# Patient Record
Sex: Female | Born: 1945 | Hispanic: No | Marital: Single | Smoking: Former smoker
Health system: Southern US, Community
[De-identification: ages and names within clinical notes are randomized; demographics above are authoritative.]

## PROBLEM LIST (undated history)

## (undated) DIAGNOSIS — J449 Chronic obstructive pulmonary disease, unspecified: Secondary | ICD-10-CM

## (undated) DIAGNOSIS — I4891 Unspecified atrial fibrillation: Secondary | ICD-10-CM

## (undated) DIAGNOSIS — E119 Type 2 diabetes mellitus without complications: Secondary | ICD-10-CM

---

## 2015-12-05 DIAGNOSIS — R05 Cough: Secondary | ICD-10-CM | POA: Diagnosis not present

## 2015-12-05 DIAGNOSIS — J069 Acute upper respiratory infection, unspecified: Secondary | ICD-10-CM | POA: Diagnosis not present

## 2016-01-04 DIAGNOSIS — J449 Chronic obstructive pulmonary disease, unspecified: Secondary | ICD-10-CM | POA: Diagnosis not present

## 2016-01-18 DIAGNOSIS — Z720 Tobacco use: Secondary | ICD-10-CM | POA: Diagnosis not present

## 2016-01-18 DIAGNOSIS — J441 Chronic obstructive pulmonary disease with (acute) exacerbation: Secondary | ICD-10-CM | POA: Diagnosis not present

## 2016-01-18 DIAGNOSIS — R0602 Shortness of breath: Secondary | ICD-10-CM | POA: Diagnosis not present

## 2016-01-18 DIAGNOSIS — R69 Illness, unspecified: Secondary | ICD-10-CM | POA: Diagnosis not present

## 2016-01-30 DIAGNOSIS — J441 Chronic obstructive pulmonary disease with (acute) exacerbation: Secondary | ICD-10-CM | POA: Diagnosis not present

## 2016-01-30 DIAGNOSIS — Z6828 Body mass index (BMI) 28.0-28.9, adult: Secondary | ICD-10-CM | POA: Diagnosis not present

## 2016-01-30 DIAGNOSIS — E663 Overweight: Secondary | ICD-10-CM | POA: Diagnosis not present

## 2016-01-30 DIAGNOSIS — B370 Candidal stomatitis: Secondary | ICD-10-CM | POA: Diagnosis not present

## 2016-01-31 DIAGNOSIS — R5383 Other fatigue: Secondary | ICD-10-CM | POA: Diagnosis not present

## 2016-01-31 DIAGNOSIS — E559 Vitamin D deficiency, unspecified: Secondary | ICD-10-CM | POA: Diagnosis not present

## 2016-01-31 DIAGNOSIS — R918 Other nonspecific abnormal finding of lung field: Secondary | ICD-10-CM | POA: Diagnosis not present

## 2016-01-31 DIAGNOSIS — J449 Chronic obstructive pulmonary disease, unspecified: Secondary | ICD-10-CM | POA: Diagnosis not present

## 2016-01-31 DIAGNOSIS — G4733 Obstructive sleep apnea (adult) (pediatric): Secondary | ICD-10-CM | POA: Diagnosis not present

## 2016-01-31 DIAGNOSIS — J301 Allergic rhinitis due to pollen: Secondary | ICD-10-CM | POA: Diagnosis not present

## 2016-01-31 DIAGNOSIS — R69 Illness, unspecified: Secondary | ICD-10-CM | POA: Diagnosis not present

## 2016-02-27 DIAGNOSIS — Z1389 Encounter for screening for other disorder: Secondary | ICD-10-CM | POA: Diagnosis not present

## 2016-02-27 DIAGNOSIS — E559 Vitamin D deficiency, unspecified: Secondary | ICD-10-CM | POA: Diagnosis not present

## 2016-02-27 DIAGNOSIS — R69 Illness, unspecified: Secondary | ICD-10-CM | POA: Diagnosis not present

## 2016-02-27 DIAGNOSIS — R252 Cramp and spasm: Secondary | ICD-10-CM | POA: Diagnosis not present

## 2016-02-27 DIAGNOSIS — M810 Age-related osteoporosis without current pathological fracture: Secondary | ICD-10-CM | POA: Diagnosis not present

## 2016-02-27 DIAGNOSIS — Z79899 Other long term (current) drug therapy: Secondary | ICD-10-CM | POA: Diagnosis not present

## 2016-02-27 DIAGNOSIS — R635 Abnormal weight gain: Secondary | ICD-10-CM | POA: Diagnosis not present

## 2016-02-27 DIAGNOSIS — R7303 Prediabetes: Secondary | ICD-10-CM | POA: Diagnosis not present

## 2016-02-27 DIAGNOSIS — E785 Hyperlipidemia, unspecified: Secondary | ICD-10-CM | POA: Diagnosis not present

## 2016-02-27 DIAGNOSIS — J449 Chronic obstructive pulmonary disease, unspecified: Secondary | ICD-10-CM | POA: Diagnosis not present

## 2016-02-27 DIAGNOSIS — K210 Gastro-esophageal reflux disease with esophagitis: Secondary | ICD-10-CM | POA: Diagnosis not present

## 2016-03-13 DIAGNOSIS — M7700 Medial epicondylitis, unspecified elbow: Secondary | ICD-10-CM | POA: Diagnosis not present

## 2016-03-17 DIAGNOSIS — K529 Noninfective gastroenteritis and colitis, unspecified: Secondary | ICD-10-CM | POA: Diagnosis not present

## 2016-04-02 DIAGNOSIS — G4733 Obstructive sleep apnea (adult) (pediatric): Secondary | ICD-10-CM | POA: Diagnosis not present

## 2016-04-08 DIAGNOSIS — R220 Localized swelling, mass and lump, head: Secondary | ICD-10-CM | POA: Diagnosis not present

## 2016-04-16 DIAGNOSIS — R6884 Jaw pain: Secondary | ICD-10-CM | POA: Diagnosis not present

## 2016-04-16 DIAGNOSIS — M542 Cervicalgia: Secondary | ICD-10-CM | POA: Diagnosis not present

## 2016-04-16 DIAGNOSIS — Z720 Tobacco use: Secondary | ICD-10-CM | POA: Diagnosis not present

## 2016-04-16 DIAGNOSIS — M50323 Other cervical disc degeneration at C6-C7 level: Secondary | ICD-10-CM | POA: Diagnosis not present

## 2016-04-16 DIAGNOSIS — M50322 Other cervical disc degeneration at C5-C6 level: Secondary | ICD-10-CM | POA: Diagnosis not present

## 2016-04-28 DIAGNOSIS — R6884 Jaw pain: Secondary | ICD-10-CM | POA: Diagnosis not present

## 2016-04-30 DIAGNOSIS — R030 Elevated blood-pressure reading, without diagnosis of hypertension: Secondary | ICD-10-CM | POA: Diagnosis not present

## 2016-04-30 DIAGNOSIS — H9201 Otalgia, right ear: Secondary | ICD-10-CM | POA: Diagnosis not present

## 2016-04-30 DIAGNOSIS — R220 Localized swelling, mass and lump, head: Secondary | ICD-10-CM | POA: Diagnosis not present

## 2016-04-30 DIAGNOSIS — K1120 Sialoadenitis, unspecified: Secondary | ICD-10-CM | POA: Diagnosis not present

## 2016-04-30 DIAGNOSIS — R591 Generalized enlarged lymph nodes: Secondary | ICD-10-CM | POA: Diagnosis not present

## 2016-05-09 DIAGNOSIS — N281 Cyst of kidney, acquired: Secondary | ICD-10-CM | POA: Diagnosis not present

## 2016-05-09 DIAGNOSIS — N200 Calculus of kidney: Secondary | ICD-10-CM | POA: Diagnosis not present

## 2016-05-09 DIAGNOSIS — J189 Pneumonia, unspecified organism: Secondary | ICD-10-CM | POA: Diagnosis not present

## 2016-05-09 DIAGNOSIS — K5100 Ulcerative (chronic) pancolitis without complications: Secondary | ICD-10-CM | POA: Diagnosis not present

## 2016-05-20 DIAGNOSIS — J449 Chronic obstructive pulmonary disease, unspecified: Secondary | ICD-10-CM | POA: Diagnosis not present

## 2016-05-20 DIAGNOSIS — I340 Nonrheumatic mitral (valve) insufficiency: Secondary | ICD-10-CM | POA: Diagnosis not present

## 2016-05-20 DIAGNOSIS — F17210 Nicotine dependence, cigarettes, uncomplicated: Secondary | ICD-10-CM | POA: Diagnosis not present

## 2016-05-20 DIAGNOSIS — I361 Nonrheumatic tricuspid (valve) insufficiency: Secondary | ICD-10-CM | POA: Diagnosis not present

## 2016-05-20 DIAGNOSIS — Z888 Allergy status to other drugs, medicaments and biological substances status: Secondary | ICD-10-CM | POA: Diagnosis not present

## 2016-05-20 DIAGNOSIS — R69 Illness, unspecified: Secondary | ICD-10-CM | POA: Diagnosis not present

## 2016-05-20 DIAGNOSIS — R609 Edema, unspecified: Secondary | ICD-10-CM | POA: Diagnosis not present

## 2016-05-20 DIAGNOSIS — R0789 Other chest pain: Secondary | ICD-10-CM | POA: Diagnosis not present

## 2016-05-20 DIAGNOSIS — K219 Gastro-esophageal reflux disease without esophagitis: Secondary | ICD-10-CM | POA: Diagnosis not present

## 2016-05-20 DIAGNOSIS — R05 Cough: Secondary | ICD-10-CM | POA: Diagnosis not present

## 2016-05-20 DIAGNOSIS — M25511 Pain in right shoulder: Secondary | ICD-10-CM | POA: Diagnosis not present

## 2016-05-20 DIAGNOSIS — J45909 Unspecified asthma, uncomplicated: Secondary | ICD-10-CM | POA: Diagnosis not present

## 2016-05-20 DIAGNOSIS — I4891 Unspecified atrial fibrillation: Secondary | ICD-10-CM | POA: Diagnosis not present

## 2016-05-20 DIAGNOSIS — R0602 Shortness of breath: Secondary | ICD-10-CM | POA: Diagnosis not present

## 2016-05-20 DIAGNOSIS — E041 Nontoxic single thyroid nodule: Secondary | ICD-10-CM | POA: Diagnosis not present

## 2016-05-20 DIAGNOSIS — I480 Paroxysmal atrial fibrillation: Secondary | ICD-10-CM | POA: Diagnosis not present

## 2016-05-20 DIAGNOSIS — F418 Other specified anxiety disorders: Secondary | ICD-10-CM

## 2016-05-20 DIAGNOSIS — R079 Chest pain, unspecified: Secondary | ICD-10-CM | POA: Diagnosis not present

## 2016-05-21 DIAGNOSIS — J449 Chronic obstructive pulmonary disease, unspecified: Secondary | ICD-10-CM | POA: Diagnosis not present

## 2016-05-21 DIAGNOSIS — I361 Nonrheumatic tricuspid (valve) insufficiency: Secondary | ICD-10-CM | POA: Diagnosis not present

## 2016-05-21 DIAGNOSIS — R079 Chest pain, unspecified: Secondary | ICD-10-CM | POA: Diagnosis not present

## 2016-05-21 DIAGNOSIS — M25511 Pain in right shoulder: Secondary | ICD-10-CM | POA: Diagnosis not present

## 2016-05-21 DIAGNOSIS — R0789 Other chest pain: Secondary | ICD-10-CM | POA: Diagnosis not present

## 2016-05-21 DIAGNOSIS — I340 Nonrheumatic mitral (valve) insufficiency: Secondary | ICD-10-CM | POA: Diagnosis not present

## 2016-05-21 DIAGNOSIS — I4891 Unspecified atrial fibrillation: Secondary | ICD-10-CM | POA: Diagnosis not present

## 2016-05-21 DIAGNOSIS — R69 Illness, unspecified: Secondary | ICD-10-CM | POA: Diagnosis not present

## 2016-05-22 DIAGNOSIS — J449 Chronic obstructive pulmonary disease, unspecified: Secondary | ICD-10-CM | POA: Diagnosis not present

## 2016-05-22 DIAGNOSIS — R69 Illness, unspecified: Secondary | ICD-10-CM | POA: Diagnosis not present

## 2016-05-22 DIAGNOSIS — I4891 Unspecified atrial fibrillation: Secondary | ICD-10-CM | POA: Diagnosis not present

## 2016-05-22 DIAGNOSIS — M25511 Pain in right shoulder: Secondary | ICD-10-CM | POA: Diagnosis not present

## 2016-05-22 DIAGNOSIS — R0789 Other chest pain: Secondary | ICD-10-CM | POA: Diagnosis not present

## 2016-05-23 DIAGNOSIS — R69 Illness, unspecified: Secondary | ICD-10-CM | POA: Diagnosis not present

## 2016-05-23 DIAGNOSIS — I4891 Unspecified atrial fibrillation: Secondary | ICD-10-CM | POA: Diagnosis not present

## 2016-05-23 DIAGNOSIS — J449 Chronic obstructive pulmonary disease, unspecified: Secondary | ICD-10-CM | POA: Diagnosis not present

## 2016-05-23 DIAGNOSIS — R0789 Other chest pain: Secondary | ICD-10-CM | POA: Diagnosis not present

## 2016-05-23 DIAGNOSIS — M25511 Pain in right shoulder: Secondary | ICD-10-CM | POA: Diagnosis not present

## 2016-05-30 DIAGNOSIS — D649 Anemia, unspecified: Secondary | ICD-10-CM | POA: Diagnosis not present

## 2016-05-30 DIAGNOSIS — J449 Chronic obstructive pulmonary disease, unspecified: Secondary | ICD-10-CM | POA: Diagnosis not present

## 2016-05-30 DIAGNOSIS — M25532 Pain in left wrist: Secondary | ICD-10-CM | POA: Diagnosis not present

## 2016-05-30 DIAGNOSIS — E876 Hypokalemia: Secondary | ICD-10-CM | POA: Diagnosis not present

## 2016-05-30 DIAGNOSIS — R072 Precordial pain: Secondary | ICD-10-CM | POA: Diagnosis not present

## 2016-05-30 DIAGNOSIS — E041 Nontoxic single thyroid nodule: Secondary | ICD-10-CM

## 2016-05-30 DIAGNOSIS — J45909 Unspecified asthma, uncomplicated: Secondary | ICD-10-CM | POA: Diagnosis not present

## 2016-05-30 DIAGNOSIS — R0600 Dyspnea, unspecified: Secondary | ICD-10-CM | POA: Diagnosis not present

## 2016-05-30 DIAGNOSIS — A047 Enterocolitis due to Clostridium difficile: Secondary | ICD-10-CM | POA: Diagnosis not present

## 2016-05-30 DIAGNOSIS — I4891 Unspecified atrial fibrillation: Secondary | ICD-10-CM | POA: Diagnosis not present

## 2016-05-30 DIAGNOSIS — E871 Hypo-osmolality and hyponatremia: Secondary | ICD-10-CM | POA: Diagnosis not present

## 2016-05-30 DIAGNOSIS — I480 Paroxysmal atrial fibrillation: Secondary | ICD-10-CM | POA: Diagnosis not present

## 2016-05-30 DIAGNOSIS — S6992XA Unspecified injury of left wrist, hand and finger(s), initial encounter: Secondary | ICD-10-CM | POA: Diagnosis not present

## 2016-05-30 DIAGNOSIS — M11232 Other chondrocalcinosis, left wrist: Secondary | ICD-10-CM | POA: Diagnosis not present

## 2016-05-30 DIAGNOSIS — I482 Chronic atrial fibrillation: Secondary | ICD-10-CM | POA: Diagnosis not present

## 2016-05-30 DIAGNOSIS — R079 Chest pain, unspecified: Secondary | ICD-10-CM | POA: Diagnosis not present

## 2016-05-30 DIAGNOSIS — M10032 Idiopathic gout, left wrist: Secondary | ICD-10-CM | POA: Diagnosis not present

## 2016-05-30 DIAGNOSIS — M79602 Pain in left arm: Secondary | ICD-10-CM | POA: Diagnosis not present

## 2016-05-30 DIAGNOSIS — M7989 Other specified soft tissue disorders: Secondary | ICD-10-CM | POA: Diagnosis not present

## 2016-05-31 DIAGNOSIS — M25532 Pain in left wrist: Secondary | ICD-10-CM | POA: Diagnosis not present

## 2016-05-31 DIAGNOSIS — J449 Chronic obstructive pulmonary disease, unspecified: Secondary | ICD-10-CM | POA: Diagnosis not present

## 2016-05-31 DIAGNOSIS — I4891 Unspecified atrial fibrillation: Secondary | ICD-10-CM | POA: Diagnosis not present

## 2016-05-31 DIAGNOSIS — A047 Enterocolitis due to Clostridium difficile: Secondary | ICD-10-CM | POA: Diagnosis not present

## 2016-05-31 DIAGNOSIS — M10032 Idiopathic gout, left wrist: Secondary | ICD-10-CM | POA: Diagnosis not present

## 2016-06-01 DIAGNOSIS — J449 Chronic obstructive pulmonary disease, unspecified: Secondary | ICD-10-CM | POA: Diagnosis not present

## 2016-06-01 DIAGNOSIS — I4891 Unspecified atrial fibrillation: Secondary | ICD-10-CM | POA: Diagnosis not present

## 2016-06-01 DIAGNOSIS — R079 Chest pain, unspecified: Secondary | ICD-10-CM | POA: Diagnosis not present

## 2016-06-01 DIAGNOSIS — A047 Enterocolitis due to Clostridium difficile: Secondary | ICD-10-CM | POA: Diagnosis not present

## 2016-06-01 DIAGNOSIS — M25532 Pain in left wrist: Secondary | ICD-10-CM | POA: Diagnosis not present

## 2016-06-01 DIAGNOSIS — M10032 Idiopathic gout, left wrist: Secondary | ICD-10-CM | POA: Diagnosis not present

## 2016-06-02 DIAGNOSIS — I4891 Unspecified atrial fibrillation: Secondary | ICD-10-CM | POA: Diagnosis not present

## 2016-06-02 DIAGNOSIS — A047 Enterocolitis due to Clostridium difficile: Secondary | ICD-10-CM | POA: Diagnosis not present

## 2016-06-02 DIAGNOSIS — J449 Chronic obstructive pulmonary disease, unspecified: Secondary | ICD-10-CM | POA: Diagnosis not present

## 2016-06-02 DIAGNOSIS — M25532 Pain in left wrist: Secondary | ICD-10-CM | POA: Diagnosis not present

## 2016-06-03 DIAGNOSIS — Z9189 Other specified personal risk factors, not elsewhere classified: Secondary | ICD-10-CM | POA: Diagnosis not present

## 2016-06-03 DIAGNOSIS — M11232 Other chondrocalcinosis, left wrist: Secondary | ICD-10-CM | POA: Diagnosis not present

## 2016-06-03 DIAGNOSIS — I4891 Unspecified atrial fibrillation: Secondary | ICD-10-CM | POA: Diagnosis not present

## 2016-06-03 DIAGNOSIS — R319 Hematuria, unspecified: Secondary | ICD-10-CM | POA: Diagnosis not present

## 2016-06-03 DIAGNOSIS — D72829 Elevated white blood cell count, unspecified: Secondary | ICD-10-CM | POA: Diagnosis not present

## 2016-06-03 DIAGNOSIS — A047 Enterocolitis due to Clostridium difficile: Secondary | ICD-10-CM | POA: Diagnosis not present

## 2016-06-03 DIAGNOSIS — E871 Hypo-osmolality and hyponatremia: Secondary | ICD-10-CM | POA: Diagnosis not present

## 2016-06-03 DIAGNOSIS — J441 Chronic obstructive pulmonary disease with (acute) exacerbation: Secondary | ICD-10-CM | POA: Diagnosis not present

## 2016-06-09 DIAGNOSIS — J449 Chronic obstructive pulmonary disease, unspecified: Secondary | ICD-10-CM | POA: Diagnosis not present

## 2016-06-09 DIAGNOSIS — R918 Other nonspecific abnormal finding of lung field: Secondary | ICD-10-CM | POA: Diagnosis not present

## 2016-06-09 DIAGNOSIS — G4733 Obstructive sleep apnea (adult) (pediatric): Secondary | ICD-10-CM | POA: Diagnosis not present

## 2016-06-09 DIAGNOSIS — J301 Allergic rhinitis due to pollen: Secondary | ICD-10-CM | POA: Diagnosis not present

## 2016-06-10 DIAGNOSIS — E785 Hyperlipidemia, unspecified: Secondary | ICD-10-CM | POA: Diagnosis not present

## 2016-06-10 DIAGNOSIS — I10 Essential (primary) hypertension: Secondary | ICD-10-CM | POA: Diagnosis not present

## 2016-06-10 DIAGNOSIS — I480 Paroxysmal atrial fibrillation: Secondary | ICD-10-CM | POA: Diagnosis not present

## 2016-06-10 DIAGNOSIS — R69 Illness, unspecified: Secondary | ICD-10-CM | POA: Diagnosis not present

## 2016-06-19 DIAGNOSIS — D72829 Elevated white blood cell count, unspecified: Secondary | ICD-10-CM | POA: Diagnosis not present

## 2016-06-24 DIAGNOSIS — R918 Other nonspecific abnormal finding of lung field: Secondary | ICD-10-CM | POA: Diagnosis not present

## 2016-06-24 DIAGNOSIS — N281 Cyst of kidney, acquired: Secondary | ICD-10-CM | POA: Diagnosis not present

## 2016-06-24 DIAGNOSIS — D649 Anemia, unspecified: Secondary | ICD-10-CM | POA: Diagnosis not present

## 2016-06-25 DIAGNOSIS — R69 Illness, unspecified: Secondary | ICD-10-CM | POA: Diagnosis not present

## 2016-06-25 DIAGNOSIS — E785 Hyperlipidemia, unspecified: Secondary | ICD-10-CM | POA: Diagnosis not present

## 2016-06-25 DIAGNOSIS — I481 Persistent atrial fibrillation: Secondary | ICD-10-CM | POA: Diagnosis not present

## 2016-06-25 DIAGNOSIS — Z789 Other specified health status: Secondary | ICD-10-CM | POA: Diagnosis not present

## 2016-07-03 DIAGNOSIS — R220 Localized swelling, mass and lump, head: Secondary | ICD-10-CM | POA: Diagnosis not present

## 2016-07-03 DIAGNOSIS — J439 Emphysema, unspecified: Secondary | ICD-10-CM | POA: Diagnosis not present

## 2016-07-03 DIAGNOSIS — R591 Generalized enlarged lymph nodes: Secondary | ICD-10-CM | POA: Diagnosis not present

## 2016-07-03 DIAGNOSIS — H9201 Otalgia, right ear: Secondary | ICD-10-CM | POA: Diagnosis not present

## 2016-07-03 DIAGNOSIS — R221 Localized swelling, mass and lump, neck: Secondary | ICD-10-CM | POA: Diagnosis not present

## 2016-07-08 DIAGNOSIS — I4891 Unspecified atrial fibrillation: Secondary | ICD-10-CM | POA: Diagnosis not present

## 2016-07-08 DIAGNOSIS — R0602 Shortness of breath: Secondary | ICD-10-CM | POA: Diagnosis not present

## 2016-07-08 DIAGNOSIS — D72829 Elevated white blood cell count, unspecified: Secondary | ICD-10-CM | POA: Diagnosis not present

## 2016-07-08 DIAGNOSIS — R609 Edema, unspecified: Secondary | ICD-10-CM | POA: Diagnosis not present

## 2016-07-08 DIAGNOSIS — D649 Anemia, unspecified: Secondary | ICD-10-CM | POA: Diagnosis not present

## 2016-07-08 DIAGNOSIS — E871 Hypo-osmolality and hyponatremia: Secondary | ICD-10-CM | POA: Diagnosis not present

## 2016-07-08 DIAGNOSIS — A047 Enterocolitis due to Clostridium difficile: Secondary | ICD-10-CM | POA: Diagnosis not present

## 2016-07-08 DIAGNOSIS — J441 Chronic obstructive pulmonary disease with (acute) exacerbation: Secondary | ICD-10-CM | POA: Diagnosis not present

## 2016-07-08 DIAGNOSIS — R319 Hematuria, unspecified: Secondary | ICD-10-CM | POA: Diagnosis not present

## 2016-07-08 DIAGNOSIS — M11232 Other chondrocalcinosis, left wrist: Secondary | ICD-10-CM | POA: Diagnosis not present

## 2016-07-14 DIAGNOSIS — I7090 Unspecified atherosclerosis: Secondary | ICD-10-CM | POA: Diagnosis not present

## 2016-07-14 DIAGNOSIS — M479 Spondylosis, unspecified: Secondary | ICD-10-CM | POA: Diagnosis not present

## 2016-07-14 DIAGNOSIS — G4733 Obstructive sleep apnea (adult) (pediatric): Secondary | ICD-10-CM | POA: Diagnosis not present

## 2016-07-14 DIAGNOSIS — H9201 Otalgia, right ear: Secondary | ICD-10-CM | POA: Diagnosis not present

## 2016-07-14 DIAGNOSIS — I899 Noninfective disorder of lymphatic vessels and lymph nodes, unspecified: Secondary | ICD-10-CM | POA: Diagnosis not present

## 2016-07-14 DIAGNOSIS — J439 Emphysema, unspecified: Secondary | ICD-10-CM | POA: Diagnosis not present

## 2016-07-14 DIAGNOSIS — R220 Localized swelling, mass and lump, head: Secondary | ICD-10-CM | POA: Diagnosis not present

## 2016-07-22 DIAGNOSIS — R0602 Shortness of breath: Secondary | ICD-10-CM | POA: Diagnosis not present

## 2016-07-22 DIAGNOSIS — G4730 Sleep apnea, unspecified: Secondary | ICD-10-CM | POA: Diagnosis not present

## 2016-07-22 DIAGNOSIS — I10 Essential (primary) hypertension: Secondary | ICD-10-CM | POA: Diagnosis not present

## 2016-07-22 DIAGNOSIS — I4891 Unspecified atrial fibrillation: Secondary | ICD-10-CM | POA: Diagnosis not present

## 2016-07-22 DIAGNOSIS — K219 Gastro-esophageal reflux disease without esophagitis: Secondary | ICD-10-CM | POA: Diagnosis not present

## 2016-07-22 DIAGNOSIS — M810 Age-related osteoporosis without current pathological fracture: Secondary | ICD-10-CM | POA: Diagnosis not present

## 2016-07-22 DIAGNOSIS — H9312 Tinnitus, left ear: Secondary | ICD-10-CM | POA: Diagnosis not present

## 2016-07-22 DIAGNOSIS — E785 Hyperlipidemia, unspecified: Secondary | ICD-10-CM | POA: Diagnosis not present

## 2016-07-22 DIAGNOSIS — I481 Persistent atrial fibrillation: Secondary | ICD-10-CM | POA: Diagnosis not present

## 2016-07-22 DIAGNOSIS — J45909 Unspecified asthma, uncomplicated: Secondary | ICD-10-CM | POA: Diagnosis not present

## 2016-07-22 DIAGNOSIS — Z01810 Encounter for preprocedural cardiovascular examination: Secondary | ICD-10-CM | POA: Diagnosis not present

## 2016-07-22 DIAGNOSIS — J449 Chronic obstructive pulmonary disease, unspecified: Secondary | ICD-10-CM | POA: Diagnosis not present

## 2016-08-05 DIAGNOSIS — I4891 Unspecified atrial fibrillation: Secondary | ICD-10-CM | POA: Diagnosis not present

## 2016-08-05 DIAGNOSIS — R0602 Shortness of breath: Secondary | ICD-10-CM | POA: Diagnosis not present

## 2016-08-05 DIAGNOSIS — M11232 Other chondrocalcinosis, left wrist: Secondary | ICD-10-CM | POA: Diagnosis not present

## 2016-08-05 DIAGNOSIS — E663 Overweight: Secondary | ICD-10-CM | POA: Diagnosis not present

## 2016-08-05 DIAGNOSIS — R195 Other fecal abnormalities: Secondary | ICD-10-CM | POA: Diagnosis not present

## 2016-08-05 DIAGNOSIS — Z79899 Other long term (current) drug therapy: Secondary | ICD-10-CM | POA: Diagnosis not present

## 2016-08-05 DIAGNOSIS — Z23 Encounter for immunization: Secondary | ICD-10-CM | POA: Diagnosis not present

## 2016-08-05 DIAGNOSIS — Z1212 Encounter for screening for malignant neoplasm of rectum: Secondary | ICD-10-CM | POA: Diagnosis not present

## 2016-08-13 DIAGNOSIS — E785 Hyperlipidemia, unspecified: Secondary | ICD-10-CM | POA: Diagnosis not present

## 2016-08-13 DIAGNOSIS — I10 Essential (primary) hypertension: Secondary | ICD-10-CM | POA: Diagnosis not present

## 2016-08-13 DIAGNOSIS — Z789 Other specified health status: Secondary | ICD-10-CM | POA: Diagnosis not present

## 2016-08-13 DIAGNOSIS — I481 Persistent atrial fibrillation: Secondary | ICD-10-CM | POA: Diagnosis not present

## 2016-08-13 DIAGNOSIS — J449 Chronic obstructive pulmonary disease, unspecified: Secondary | ICD-10-CM | POA: Diagnosis not present

## 2016-08-25 DIAGNOSIS — J301 Allergic rhinitis due to pollen: Secondary | ICD-10-CM | POA: Diagnosis not present

## 2016-08-25 DIAGNOSIS — R918 Other nonspecific abnormal finding of lung field: Secondary | ICD-10-CM | POA: Diagnosis not present

## 2016-08-25 DIAGNOSIS — J449 Chronic obstructive pulmonary disease, unspecified: Secondary | ICD-10-CM | POA: Diagnosis not present

## 2016-08-25 DIAGNOSIS — G4733 Obstructive sleep apnea (adult) (pediatric): Secondary | ICD-10-CM | POA: Diagnosis not present

## 2016-09-04 DIAGNOSIS — G4733 Obstructive sleep apnea (adult) (pediatric): Secondary | ICD-10-CM | POA: Diagnosis not present

## 2016-09-11 DIAGNOSIS — I081 Rheumatic disorders of both mitral and tricuspid valves: Secondary | ICD-10-CM | POA: Diagnosis not present

## 2016-09-11 DIAGNOSIS — I517 Cardiomegaly: Secondary | ICD-10-CM | POA: Diagnosis not present

## 2016-09-11 DIAGNOSIS — I4891 Unspecified atrial fibrillation: Secondary | ICD-10-CM | POA: Diagnosis not present

## 2016-09-14 DIAGNOSIS — E785 Hyperlipidemia, unspecified: Secondary | ICD-10-CM | POA: Diagnosis not present

## 2016-09-14 DIAGNOSIS — R9431 Abnormal electrocardiogram [ECG] [EKG]: Secondary | ICD-10-CM | POA: Diagnosis not present

## 2016-09-14 DIAGNOSIS — D500 Iron deficiency anemia secondary to blood loss (chronic): Secondary | ICD-10-CM | POA: Diagnosis not present

## 2016-09-14 DIAGNOSIS — J449 Chronic obstructive pulmonary disease, unspecified: Secondary | ICD-10-CM | POA: Diagnosis not present

## 2016-09-14 DIAGNOSIS — D72829 Elevated white blood cell count, unspecified: Secondary | ICD-10-CM | POA: Diagnosis not present

## 2016-09-14 DIAGNOSIS — Z8669 Personal history of other diseases of the nervous system and sense organs: Secondary | ICD-10-CM | POA: Diagnosis not present

## 2016-09-14 DIAGNOSIS — R69 Illness, unspecified: Secondary | ICD-10-CM | POA: Diagnosis not present

## 2016-09-14 DIAGNOSIS — D649 Anemia, unspecified: Secondary | ICD-10-CM | POA: Diagnosis not present

## 2016-09-14 DIAGNOSIS — Z79899 Other long term (current) drug therapy: Secondary | ICD-10-CM | POA: Diagnosis not present

## 2016-09-14 DIAGNOSIS — I481 Persistent atrial fibrillation: Secondary | ICD-10-CM | POA: Diagnosis not present

## 2016-09-14 DIAGNOSIS — Z7901 Long term (current) use of anticoagulants: Secondary | ICD-10-CM | POA: Diagnosis not present

## 2016-09-14 DIAGNOSIS — Z538 Procedure and treatment not carried out for other reasons: Secondary | ICD-10-CM | POA: Diagnosis not present

## 2016-09-14 DIAGNOSIS — R05 Cough: Secondary | ICD-10-CM | POA: Diagnosis not present

## 2016-09-14 DIAGNOSIS — I480 Paroxysmal atrial fibrillation: Secondary | ICD-10-CM | POA: Diagnosis not present

## 2016-09-14 DIAGNOSIS — I213 ST elevation (STEMI) myocardial infarction of unspecified site: Secondary | ICD-10-CM | POA: Diagnosis not present

## 2016-09-14 DIAGNOSIS — I4892 Unspecified atrial flutter: Secondary | ICD-10-CM | POA: Diagnosis not present

## 2016-09-14 DIAGNOSIS — I313 Pericardial effusion (noninflammatory): Secondary | ICD-10-CM | POA: Diagnosis not present

## 2016-09-22 DIAGNOSIS — R05 Cough: Secondary | ICD-10-CM | POA: Diagnosis not present

## 2016-09-22 DIAGNOSIS — I4891 Unspecified atrial fibrillation: Secondary | ICD-10-CM | POA: Diagnosis not present

## 2016-09-22 DIAGNOSIS — I509 Heart failure, unspecified: Secondary | ICD-10-CM | POA: Diagnosis not present

## 2016-09-22 DIAGNOSIS — J441 Chronic obstructive pulmonary disease with (acute) exacerbation: Secondary | ICD-10-CM | POA: Diagnosis not present

## 2016-09-29 DIAGNOSIS — G4733 Obstructive sleep apnea (adult) (pediatric): Secondary | ICD-10-CM | POA: Diagnosis not present

## 2016-09-29 DIAGNOSIS — R0602 Shortness of breath: Secondary | ICD-10-CM | POA: Diagnosis not present

## 2016-09-29 DIAGNOSIS — R609 Edema, unspecified: Secondary | ICD-10-CM | POA: Diagnosis not present

## 2016-09-29 DIAGNOSIS — I509 Heart failure, unspecified: Secondary | ICD-10-CM | POA: Diagnosis not present

## 2016-09-29 DIAGNOSIS — J441 Chronic obstructive pulmonary disease with (acute) exacerbation: Secondary | ICD-10-CM | POA: Diagnosis not present

## 2016-10-04 DIAGNOSIS — G4733 Obstructive sleep apnea (adult) (pediatric): Secondary | ICD-10-CM | POA: Diagnosis not present

## 2016-10-22 DIAGNOSIS — E785 Hyperlipidemia, unspecified: Secondary | ICD-10-CM | POA: Diagnosis not present

## 2016-10-22 DIAGNOSIS — J449 Chronic obstructive pulmonary disease, unspecified: Secondary | ICD-10-CM | POA: Diagnosis not present

## 2016-10-22 DIAGNOSIS — G4730 Sleep apnea, unspecified: Secondary | ICD-10-CM | POA: Diagnosis not present

## 2016-10-22 DIAGNOSIS — I10 Essential (primary) hypertension: Secondary | ICD-10-CM | POA: Diagnosis not present

## 2016-10-22 DIAGNOSIS — Z789 Other specified health status: Secondary | ICD-10-CM | POA: Diagnosis not present

## 2016-10-22 DIAGNOSIS — I481 Persistent atrial fibrillation: Secondary | ICD-10-CM | POA: Diagnosis not present

## 2016-11-04 DIAGNOSIS — G4733 Obstructive sleep apnea (adult) (pediatric): Secondary | ICD-10-CM | POA: Diagnosis not present

## 2016-12-05 ENCOUNTER — Encounter (HOSPITAL_COMMUNITY): Payer: Self-pay | Admitting: Emergency Medicine

## 2016-12-05 ENCOUNTER — Inpatient Hospital Stay (HOSPITAL_COMMUNITY)
Admission: EM | Admit: 2016-12-05 | Discharge: 2016-12-09 | DRG: 392 | Disposition: A | Discharge status: Home / Self Care | Payer: Medicare HMO | Attending: Internal Medicine | Admitting: Internal Medicine

## 2016-12-05 DIAGNOSIS — R0602 Shortness of breath: Secondary | ICD-10-CM

## 2016-12-05 DIAGNOSIS — N179 Acute kidney failure, unspecified: Secondary | ICD-10-CM | POA: Diagnosis not present

## 2016-12-05 DIAGNOSIS — A0811 Acute gastroenteropathy due to Norwalk agent: Secondary | ICD-10-CM

## 2016-12-05 DIAGNOSIS — I4891 Unspecified atrial fibrillation: Secondary | ICD-10-CM | POA: Diagnosis present

## 2016-12-05 DIAGNOSIS — M109 Gout, unspecified: Secondary | ICD-10-CM | POA: Diagnosis present

## 2016-12-05 DIAGNOSIS — A419 Sepsis, unspecified organism: Secondary | ICD-10-CM | POA: Diagnosis present

## 2016-12-05 DIAGNOSIS — R140 Abdominal distension (gaseous): Secondary | ICD-10-CM | POA: Diagnosis not present

## 2016-12-05 DIAGNOSIS — J449 Chronic obstructive pulmonary disease, unspecified: Secondary | ICD-10-CM | POA: Diagnosis not present

## 2016-12-05 DIAGNOSIS — R000 Tachycardia, unspecified: Secondary | ICD-10-CM | POA: Diagnosis not present

## 2016-12-05 DIAGNOSIS — E119 Type 2 diabetes mellitus without complications: Secondary | ICD-10-CM | POA: Diagnosis present

## 2016-12-05 DIAGNOSIS — R6510 Systemic inflammatory response syndrome (SIRS) of non-infectious origin without acute organ dysfunction: Secondary | ICD-10-CM | POA: Diagnosis not present

## 2016-12-05 DIAGNOSIS — A0819 Acute gastroenteropathy due to other small round viruses: Principal | ICD-10-CM | POA: Diagnosis present

## 2016-12-05 DIAGNOSIS — E876 Hypokalemia: Secondary | ICD-10-CM | POA: Diagnosis present

## 2016-12-05 DIAGNOSIS — Z7901 Long term (current) use of anticoagulants: Secondary | ICD-10-CM

## 2016-12-05 DIAGNOSIS — Z79899 Other long term (current) drug therapy: Secondary | ICD-10-CM

## 2016-12-05 DIAGNOSIS — Z87891 Personal history of nicotine dependence: Secondary | ICD-10-CM

## 2016-12-05 DIAGNOSIS — E8770 Fluid overload, unspecified: Secondary | ICD-10-CM | POA: Diagnosis present

## 2016-12-05 DIAGNOSIS — R531 Weakness: Secondary | ICD-10-CM | POA: Diagnosis not present

## 2016-12-05 DIAGNOSIS — R112 Nausea with vomiting, unspecified: Secondary | ICD-10-CM | POA: Diagnosis not present

## 2016-12-05 DIAGNOSIS — R197 Diarrhea, unspecified: Secondary | ICD-10-CM | POA: Diagnosis present

## 2016-12-05 DIAGNOSIS — K219 Gastro-esophageal reflux disease without esophagitis: Secondary | ICD-10-CM | POA: Diagnosis present

## 2016-12-05 HISTORY — DX: Type 2 diabetes mellitus without complications: E11.9

## 2016-12-05 HISTORY — DX: Unspecified atrial fibrillation: I48.91

## 2016-12-05 HISTORY — DX: Chronic obstructive pulmonary disease, unspecified: J44.9

## 2016-12-05 LAB — CBC WITH DIFFERENTIAL/PLATELET
Basophils Absolute: 0 10*3/uL (ref 0.0–0.1)
Basophils Relative: 0 %
EOS ABS: 0.1 10*3/uL (ref 0.0–0.7)
EOS PCT: 1 %
HCT: 44.7 % (ref 36.0–46.0)
HEMOGLOBIN: 15.2 g/dL — AB (ref 12.0–15.0)
Lymphocytes Relative: 16 %
Lymphs Abs: 2 10*3/uL (ref 0.7–4.0)
MCH: 29.4 pg (ref 26.0–34.0)
MCHC: 34 g/dL (ref 30.0–36.0)
MCV: 86.5 fL (ref 78.0–100.0)
MONOS PCT: 5 %
Monocytes Absolute: 0.6 10*3/uL (ref 0.1–1.0)
Neutro Abs: 9.7 10*3/uL — ABNORMAL HIGH (ref 1.7–7.7)
Neutrophils Relative %: 78 %
PLATELETS: 259 10*3/uL (ref 150–400)
RBC: 5.17 MIL/uL — ABNORMAL HIGH (ref 3.87–5.11)
RDW: 15 % (ref 11.5–15.5)
WBC: 12.4 10*3/uL — ABNORMAL HIGH (ref 4.0–10.5)

## 2016-12-05 LAB — CBG MONITORING, ED: Glucose-Capillary: 120 mg/dL — ABNORMAL HIGH (ref 65–99)

## 2016-12-05 LAB — I-STAT CG4 LACTIC ACID, ED: LACTIC ACID, VENOUS: 1.15 mmol/L (ref 0.5–1.9)

## 2016-12-05 MED ORDER — SODIUM CHLORIDE 0.9 % IV BOLUS (SEPSIS)
1000.0000 mL | Freq: Once | INTRAVENOUS | Status: AC
  Administered 2016-12-05: 1000 mL via INTRAVENOUS

## 2016-12-05 MED ORDER — ONDANSETRON HCL 4 MG/2ML IJ SOLN
4.0000 mg | Freq: Once | INTRAMUSCULAR | Status: AC
  Administered 2016-12-05: 4 mg via INTRAVENOUS
  Filled 2016-12-05: qty 2

## 2016-12-05 NOTE — ED Triage Notes (Signed)
Per EMS, began to experience weakness, N/V/D, chills and general body aches, denies chest pain and SOB.  Her family reports that this began after she found out about the death of her grandson to a drug overdose today.  Hx of Afib, GERD, Diabetes and COPD.  CBG-150, hypotension (90's palp.)  EMS gave of NS and 4ml of zofran.

## 2016-12-05 NOTE — ED Provider Notes (Signed)
[facility]-EMERGENCY DEPT Provider Note   CSN: [id number] Arrival date & time: 12/05/16  2303     History   Chief Complaint Chief Complaint  Patient presents with  . Weakness  . Emesis    HPI Erin Waller is a 71 y.o. female.  HPI 52 and 32-year-old female with a history of A. fib on Eliquis, flecainide, Cardizem, metoprolol since the ED with tachycardia in the setting of nausea, vomiting, diarrhea that began earlier today. She denied any known sick contacts, not suspicious food intake, recent travels. Patient has had approximately 6-10 watery, nonbloody bowel movements today. She's also had several episodes of nonbloody nonbilious emesis. Endorses a dry cough and myalgias however denies any fevers, chills, rhinorrhea, congestion, chest pain, shortness of breath, abdominal pain, dysuria.   Past Medical History:  Diagnosis Date  . Atrial fibrillation (HCC)   . COPD (chronic obstructive pulmonary disease) (HCC)   . Diabetes mellitus without complication [facility])     Patient Active Problem List   Diagnosis Date Noted  . Atrial fibrillation with RVR (HCC) 12/06/2016  . Sepsis (HCC) 12/06/2016  . Diarrhea 12/06/2016  . Hypokalemia 12/06/2016  . COPD (chronic obstructive pulmonary disease) (HCC) 12/06/2016  . AKI (acute kidney injury) (HCC) 12/06/2016    History reviewed. No pertinent surgical history.  OB History    No data available       Home Medications    Prior to Admission medications   Medication Sig Start Date End Date Taking? Authorizing Provider  apixaban (ELIQUIS) 5 MG TABS tablet Take 5 mg by mouth 2 (two) times daily.   Yes Historical Provider, MD  budesonide-formoterol (SYMBICORT) 160-4.5 MCG/ACT inhaler Inhale 2 puffs into the lungs 2 (two) times daily.   Yes Historical Provider, MD  colchicine 0.6 MG tablet Take 0.6 mg by mouth 2 (two) times daily.   Yes Historical Provider, MD  diltiazem (CARDIZEM CD) 180 MG 24 hr capsule Take 180 mg by mouth daily.   Yes  Historical Provider, MD  ferrous sulfate 325 (65 FE) MG tablet Take 325 mg by mouth daily with breakfast.   Yes Historical Provider, MD  flecainide (TAMBOCOR) 50 MG tablet Take 25 mg by mouth 2 (two) times daily.   Yes Historical Provider, MD  furosemide (LASIX) 20 MG tablet Take 20 mg by mouth daily.   Yes Historical Provider, MD  lansoprazole (PREVACID) 30 MG capsule Take 30 mg by mouth daily at 12 noon.   Yes Historical Provider, MD  metoprolol tartrate (LOPRESSOR) 25 MG tablet Take 25 mg by mouth 2 (two) times daily.   Yes Historical Provider, MD  nitroGLYCERIN (NITROLINGUAL) 0.4 MG/SPRAY spray Place 1 spray under the tongue every 5 (five) minutes x 3 doses as needed for chest pain.   Yes Historical Provider, MD  spironolactone (ALDACTONE) 25 MG tablet Take 25 mg by mouth daily.   Yes Historical Provider, MD    Family History No family history on file.  Social History Social History  Substance Use Topics  . Smoking status: Former Smoker    Types: Cigarettes  . Smokeless tobacco: Not on file  . Alcohol use No     Allergies   Sulfa antibiotics   Review of Systems Review of Systems Ten systems are reviewed and are negative for acute change except as noted in the HPI   Physical Exam Updated Vital Signs BP 104/69   Pulse (!) 121   Temp 97.3 F (36.3 C) (Oral)   Resp 25   Ht 5'  5" (1.651 m)   Wt 174 lb (78.9 kg)   SpO2 96%   BMI 28.96 kg/m   Physical Exam  Constitutional: She is oriented to person, place, and time. She appears well-developed and well-nourished. No distress.  HENT:  Head: Normocephalic and atraumatic.  Nose: Nose normal.  Eyes: Conjunctivae and EOM are normal. Pupils are equal, round, and reactive to light. Right eye exhibits no discharge. Left eye exhibits no discharge. No scleral icterus.  Neck: Normal range of motion. Neck supple.  Cardiovascular: An irregularly irregular rhythm present. Tachycardia present.  Exam reveals no gallop and no friction  rub.   No murmur heard. Pulmonary/Chest: Effort normal and breath sounds normal. No stridor. No respiratory distress. She has no rales.  Abdominal: Soft. She exhibits no distension. There is no tenderness.  Musculoskeletal: She exhibits no edema or tenderness.  Neurological: She is alert and oriented to person, place, and time.  Skin: Skin is warm and dry. No rash noted. She is not diaphoretic. No erythema.  Psychiatric: She has a normal mood and affect.  Vitals reviewed.    ED Treatments / Results  Labs (all labs ordered are listed, but only abnormal results are displayed) Labs Reviewed  CBC WITH DIFFERENTIAL/PLATELET - Abnormal; Notable for the following:       Result Value   WBC 12.4 (*)    RBC 5.17 (*)    Hemoglobin 15.2 (*)    Neutro Abs 9.7 (*)    All other components within normal limits  COMPREHENSIVE METABOLIC PANEL - Abnormal; Notable for the following:    Potassium 3.4 (*)    CO2 18 (*)    Glucose, Bld 122 (*)    BUN 24 (*)    Creatinine, Ser 1.47 (*)    GFR calc non Af Amer 35 (*)    GFR calc Af Amer 41 (*)    All other components within normal limits  MAGNESIUM - Abnormal; Notable for the following:    Magnesium 1.3 (*)    All other components within normal limits  CBG MONITORING, ED - Abnormal; Notable for the following:    Glucose-Capillary 120 (*)    All other components within normal limits  GASTROINTESTINAL PANEL BY PCR, STOOL (REPLACES STOOL CULTURE)  C DIFFICILE QUICK SCREEN W PCR REFLEX  CULTURE, BLOOD (ROUTINE X 2)  CULTURE, BLOOD (ROUTINE X 2)  INFLUENZA PANEL BY PCR (TYPE A & B)  LIPASE, BLOOD  PROCALCITONIN  TSH  URINALYSIS, ROUTINE W REFLEX MICROSCOPIC  I-STAT CG4 LACTIC ACID, ED  Rosezena Sensor, ED    EKG  EKG Interpretation  Date/Time:  Saturday December 05 2016 23:06:24 EST Ventricular Rate:  114 PR Interval:    QRS Duration: 92 QT Interval:  345 QTC Calculation: 476 R Axis:   57 Text Interpretation:  Atrial fibrillation  Minimal ST depression, diffuse leads Confirmed by [facility] MD, Priscillia Fouch ([id number]) on 12/06/2016 1:49:28 AM       Radiology Dg Chest Port 1 View  Result Date: 12/06/2016 CLINICAL DATA:  Shortness of breath. EXAM: PORTABLE CHEST 1 VIEW COMPARISON:  09/22/2016 FINDINGS: Stable hyperinflation and emphysematous change. Stable cardiomediastinal contours with atherosclerosis of the aortic arch. No pulmonary edema, pleural fluid, focal airspace disease or pneumothorax. No acute osseous abnormalities. IMPRESSION: No acute abnormality.  Emphysema. Thoracic aortic atherosclerosis. Electronically Signed   By: Rubye Oaks M.D.   On: 12/06/2016 05:41   Dg Abd Portable 1v  Result Date: 12/06/2016 CLINICAL DATA:  Abdominal distention. EXAM: PORTABLE ABDOMEN -  1 VIEW COMPARISON:  None. FINDINGS: Paucity of bowel gas. No gaseous bowel distention. No evidence of free air on single supine view. No radiopaque calculi. No acute osseous abnormalities. IMPRESSION: Nonspecific bowel-gas pattern with generalized paucity of bowel gas. Electronically Signed   By: Rubye OaksMelanie  Ehinger M.D.   On: 12/06/2016 05:40    Procedures Procedures (including critical care time)  Medications Ordered in ED Medications  apixaban (ELIQUIS) tablet 5 mg (not administered)  metoprolol tartrate (LOPRESSOR) tablet 25 mg (not administered)  pantoprazole (PROTONIX) EC tablet 40 mg (not administered)  budesonide (PULMICORT) nebulizer solution 0.5 mg (0.5 mg Nebulization Given 12/06/16 0750)  arformoterol (BROVANA) nebulizer solution 15 mcg (not administered)  0.9 %  sodium chloride infusion ( Intravenous New Bag/Given 12/06/16 0602)  ondansetron (ZOFRAN) tablet 4 mg (not administered)    Or  ondansetron (ZOFRAN) injection 4 mg (not administered)  acetaminophen (TYLENOL) tablet 650 mg (not administered)    Or  acetaminophen (TYLENOL) suppository 650 mg (not administered)  albuterol (PROVENTIL) (2.5 MG/3ML) 0.083% nebulizer solution 2.5 mg (not  administered)  vancomycin (VANCOCIN) IVPB 1000 mg/200 mL premix (1,000 mg Intravenous New Bag/Given 12/06/16 0701)  potassium chloride 30 mEq in sodium chloride 0.9 % 265 mL (KCL MULTIRUN) IVPB (not administered)  piperacillin-tazobactam (ZOSYN) IVPB 3.375 g (0 g Intravenous Stopped 12/06/16 0753)  LORazepam (ATIVAN) injection 0.25 mg (not administered)  simethicone (MYLICON) chewable tablet 80 mg (not administered)  magnesium sulfate IVPB 2 g 50 mL (not administered)  sodium chloride 0.9 % bolus 1,000 mL (0 mLs Intravenous Stopped 12/06/16 0030)  ondansetron (ZOFRAN) injection 4 mg (4 mg Intravenous Given 12/05/16 2343)  ondansetron (ZOFRAN) injection 4 mg (4 mg Intravenous Given 12/06/16 0444)  sodium chloride 0.9 % bolus 1,000 mL (0 mLs Intravenous Stopped 12/06/16 0753)  diltiazem (CARDIZEM) injection 10 mg (10 mg Intravenous Given 12/06/16 0603)  piperacillin-tazobactam (ZOSYN) IVPB 3.375 g (0 g Intravenous Stopped 12/06/16 0754)     Initial Impression / Assessment and Plan / ED Course  I have reviewed the triage vital signs and the nursing notes.  Pertinent labs & imaging results that were available during my care of the patient were reviewed by me and considered in my medical decision making (see chart for details).     A. fib RVR which improved with 1 L IV fluid bolus. GI symptoms with mild cough and myalgias. Influenza negative. Screening labs with leukocytosis, mild renal insufficiency. Chest x-ray without evidence of pneumonia. GI panel ordered and currently pending. No evidence of sepsis at this time. Likely viral process.  Tachycardia returned. Additional IV fluids given. Patient is still having watery bowel movements.   Patient admitted to hospitalist service for continued hydration and further workup and management.    Nira ConnPedro Eduardo Jamielyn Petrucci, MD 12/06/16 [phone]

## 2016-12-06 ENCOUNTER — Encounter (HOSPITAL_COMMUNITY): Payer: Self-pay | Admitting: Internal Medicine

## 2016-12-06 ENCOUNTER — Observation Stay (HOSPITAL_COMMUNITY): Payer: Medicare HMO

## 2016-12-06 DIAGNOSIS — M109 Gout, unspecified: Secondary | ICD-10-CM | POA: Diagnosis present

## 2016-12-06 DIAGNOSIS — A0819 Acute gastroenteropathy due to other small round viruses: Secondary | ICD-10-CM | POA: Diagnosis not present

## 2016-12-06 DIAGNOSIS — E876 Hypokalemia: Secondary | ICD-10-CM

## 2016-12-06 DIAGNOSIS — K219 Gastro-esophageal reflux disease without esophagitis: Secondary | ICD-10-CM | POA: Diagnosis not present

## 2016-12-06 DIAGNOSIS — I4891 Unspecified atrial fibrillation: Secondary | ICD-10-CM | POA: Diagnosis not present

## 2016-12-06 DIAGNOSIS — N179 Acute kidney failure, unspecified: Secondary | ICD-10-CM | POA: Diagnosis present

## 2016-12-06 DIAGNOSIS — R0602 Shortness of breath: Secondary | ICD-10-CM | POA: Diagnosis not present

## 2016-12-06 DIAGNOSIS — J449 Chronic obstructive pulmonary disease, unspecified: Secondary | ICD-10-CM | POA: Diagnosis not present

## 2016-12-06 DIAGNOSIS — Z87891 Personal history of nicotine dependence: Secondary | ICD-10-CM | POA: Diagnosis not present

## 2016-12-06 DIAGNOSIS — E8770 Fluid overload, unspecified: Secondary | ICD-10-CM | POA: Diagnosis not present

## 2016-12-06 DIAGNOSIS — A419 Sepsis, unspecified organism: Secondary | ICD-10-CM | POA: Diagnosis not present

## 2016-12-06 DIAGNOSIS — Z7901 Long term (current) use of anticoagulants: Secondary | ICD-10-CM | POA: Diagnosis not present

## 2016-12-06 DIAGNOSIS — R140 Abdominal distension (gaseous): Secondary | ICD-10-CM | POA: Diagnosis not present

## 2016-12-06 DIAGNOSIS — R6510 Systemic inflammatory response syndrome (SIRS) of non-infectious origin without acute organ dysfunction: Secondary | ICD-10-CM | POA: Diagnosis not present

## 2016-12-06 DIAGNOSIS — R197 Diarrhea, unspecified: Secondary | ICD-10-CM

## 2016-12-06 DIAGNOSIS — A0811 Acute gastroenteropathy due to Norwalk agent: Secondary | ICD-10-CM

## 2016-12-06 DIAGNOSIS — R112 Nausea with vomiting, unspecified: Secondary | ICD-10-CM | POA: Diagnosis present

## 2016-12-06 DIAGNOSIS — Z79899 Other long term (current) drug therapy: Secondary | ICD-10-CM | POA: Diagnosis not present

## 2016-12-06 DIAGNOSIS — E119 Type 2 diabetes mellitus without complications: Secondary | ICD-10-CM | POA: Diagnosis not present

## 2016-12-06 LAB — GASTROINTESTINAL PANEL BY PCR, STOOL (REPLACES STOOL CULTURE)
ADENOVIRUS F40/41: NOT DETECTED
ASTROVIRUS: NOT DETECTED
CAMPYLOBACTER SPECIES: NOT DETECTED
CYCLOSPORA CAYETANENSIS: NOT DETECTED
Cryptosporidium: NOT DETECTED
ENTAMOEBA HISTOLYTICA: NOT DETECTED
ENTEROAGGREGATIVE E COLI (EAEC): NOT DETECTED
ENTEROPATHOGENIC E COLI (EPEC): NOT DETECTED
ENTEROTOXIGENIC E COLI (ETEC): NOT DETECTED
Giardia lamblia: NOT DETECTED
Norovirus GI/GII: DETECTED — AB
Plesimonas shigelloides: NOT DETECTED
Rotavirus A: NOT DETECTED
Salmonella species: NOT DETECTED
Sapovirus (I, II, IV, and V): NOT DETECTED
Shiga like toxin producing E coli (STEC): NOT DETECTED
Shigella/Enteroinvasive E coli (EIEC): NOT DETECTED
VIBRIO CHOLERAE: NOT DETECTED
VIBRIO SPECIES: NOT DETECTED
Yersinia enterocolitica: NOT DETECTED

## 2016-12-06 LAB — COMPREHENSIVE METABOLIC PANEL
ALT: 27 U/L (ref 14–54)
ALT: 20 U/L (ref 14–54)
AST: 24 U/L (ref 15–41)
AST: 24 U/L (ref 15–41)
Albumin: 4.5 g/dL (ref 3.5–5.0)
Albumin: 3.1 g/dL — ABNORMAL LOW (ref 3.5–5.0)
Alkaline Phosphatase: 109 U/L (ref 38–126)
Alkaline Phosphatase: 81 U/L (ref 38–126)
Anion gap: 15 (ref 5–15)
Anion gap: 11 (ref 5–15)
BILIRUBIN TOTAL: 0.5 mg/dL (ref 0.3–1.2)
BILIRUBIN TOTAL: 0.5 mg/dL (ref 0.3–1.2)
BUN: 24 mg/dL — AB (ref 6–20)
BUN: 21 mg/dL — AB (ref 6–20)
CO2: 18 mmol/L — ABNORMAL LOW (ref 22–32)
CO2: 16 mmol/L — ABNORMAL LOW (ref 22–32)
CREATININE: 1.29 mg/dL — AB (ref 0.44–1.00)
Calcium: 10 mg/dL (ref 8.9–10.3)
Calcium: 8.1 mg/dL — ABNORMAL LOW (ref 8.9–10.3)
Chloride: 105 mmol/L (ref 101–111)
Chloride: 112 mmol/L — ABNORMAL HIGH (ref 101–111)
Creatinine, Ser: 1.47 mg/dL — ABNORMAL HIGH (ref 0.44–1.00)
GFR calc Af Amer: 48 mL/min — ABNORMAL LOW (ref >60)
GFR, EST AFRICAN AMERICAN: 41 mL/min — AB (ref >60)
GFR, EST NON AFRICAN AMERICAN: 35 mL/min — AB (ref >60)
GFR, EST NON AFRICAN AMERICAN: 41 mL/min — AB (ref >60)
Glucose, Bld: 122 mg/dL — ABNORMAL HIGH (ref 65–99)
Glucose, Bld: 123 mg/dL — ABNORMAL HIGH (ref 65–99)
POTASSIUM: 3.4 mmol/L — AB (ref 3.5–5.1)
POTASSIUM: 3.9 mmol/L (ref 3.5–5.1)
Sodium: 138 mmol/L (ref 135–145)
Sodium: 139 mmol/L (ref 135–145)
TOTAL PROTEIN: 7.3 g/dL (ref 6.5–8.1)
TOTAL PROTEIN: 5.3 g/dL — AB (ref 6.5–8.1)

## 2016-12-06 LAB — MRSA PCR SCREENING: MRSA by PCR: NEGATIVE

## 2016-12-06 LAB — CBC WITH DIFFERENTIAL/PLATELET
BASOS ABS: 0 10*3/uL (ref 0.0–0.1)
Basophils Relative: 0 %
Eosinophils Absolute: 0.1 10*3/uL (ref 0.0–0.7)
Eosinophils Relative: 1 %
HEMATOCRIT: 37 % (ref 36.0–46.0)
Hemoglobin: 12.1 g/dL (ref 12.0–15.0)
LYMPHS ABS: 0.5 10*3/uL — AB (ref 0.7–4.0)
LYMPHS PCT: 7 %
MCH: 28.9 pg (ref 26.0–34.0)
MCHC: 32.7 g/dL (ref 30.0–36.0)
MCV: 88.3 fL (ref 78.0–100.0)
MONO ABS: 0.2 10*3/uL (ref 0.1–1.0)
Monocytes Relative: 3 %
Neutro Abs: 6.7 10*3/uL (ref 1.7–7.7)
Neutrophils Relative %: 89 %
Platelets: 201 10*3/uL (ref 150–400)
RBC: 4.19 MIL/uL (ref 3.87–5.11)
RDW: 15.8 % — AB (ref 11.5–15.5)
WBC: 7.6 10*3/uL (ref 4.0–10.5)

## 2016-12-06 LAB — TSH: TSH: 1.028 u[IU]/mL (ref 0.350–4.500)

## 2016-12-06 LAB — LIPASE, BLOOD: Lipase: 24 U/L (ref 11–51)

## 2016-12-06 LAB — MAGNESIUM
MAGNESIUM: 1.3 mg/dL — AB (ref 1.7–2.4)
MAGNESIUM: 1.5 mg/dL — AB (ref 1.7–2.4)

## 2016-12-06 LAB — INFLUENZA PANEL BY PCR (TYPE A & B)
INFLAPCR: NEGATIVE
INFLBPCR: NEGATIVE

## 2016-12-06 LAB — C DIFFICILE QUICK SCREEN W PCR REFLEX
C DIFFICLE (CDIFF) ANTIGEN: NEGATIVE
C Diff interpretation: NOT DETECTED
C Diff toxin: NEGATIVE

## 2016-12-06 LAB — I-STAT TROPONIN, ED: Troponin i, poc: 0 ng/mL (ref 0.00–0.08)

## 2016-12-06 LAB — PROCALCITONIN: Procalcitonin: 0.92 ng/mL

## 2016-12-06 LAB — PHOSPHORUS: Phosphorus: 3.3 mg/dL (ref 2.5–4.6)

## 2016-12-06 MED ORDER — SODIUM CHLORIDE 0.9 % IV SOLN
INTRAVENOUS | Status: DC
  Administered 2016-12-06 – 2016-12-08 (×4): via INTRAVENOUS

## 2016-12-06 MED ORDER — ONDANSETRON HCL 4 MG/2ML IJ SOLN: 4.0000 mg | Freq: Four times a day (QID) | INTRAMUSCULAR | Status: DC | PRN

## 2016-12-06 MED ORDER — ARFORMOTEROL TARTRATE 15 MCG/2ML IN NEBU
15.0000 ug | INHALATION_SOLUTION | Freq: Two times a day (BID) | RESPIRATORY_TRACT | Status: DC
  Administered 2016-12-06 – 2016-12-07 (×4): 15 ug via RESPIRATORY_TRACT
  Filled 2016-12-06 (×7): qty 2

## 2016-12-06 MED ORDER — ONDANSETRON HCL 4 MG/2ML IJ SOLN
4.0000 mg | Freq: Once | INTRAMUSCULAR | Status: AC
  Administered 2016-12-06: 4 mg via INTRAVENOUS
  Filled 2016-12-06: qty 2

## 2016-12-06 MED ORDER — METOPROLOL TARTRATE 25 MG PO TABS
25.0000 mg | ORAL_TABLET | Freq: Two times a day (BID) | ORAL | Status: DC
  Administered 2016-12-06 – 2016-12-09 (×7): 25 mg via ORAL
  Filled 2016-12-06 (×7): qty 1

## 2016-12-06 MED ORDER — ACETAMINOPHEN 325 MG PO TABS: 650.0000 mg | ORAL_TABLET | Freq: Four times a day (QID) | ORAL | Status: DC | PRN

## 2016-12-06 MED ORDER — DILTIAZEM HCL 25 MG/5ML IV SOLN
10.0000 mg | INTRAVENOUS | Status: AC
  Administered 2016-12-06: 10 mg via INTRAVENOUS
  Filled 2016-12-06: qty 5

## 2016-12-06 MED ORDER — PIPERACILLIN-TAZOBACTAM 3.375 G IVPB
3.3750 g | Freq: Three times a day (TID) | INTRAVENOUS | Status: DC
  Filled 2016-12-06 (×2): qty 50

## 2016-12-06 MED ORDER — MAGNESIUM SULFATE 2 GM/50ML IV SOLN
2.0000 g | Freq: Once | INTRAVENOUS | Status: AC
  Administered 2016-12-06: 2 g via INTRAVENOUS
  Filled 2016-12-06: qty 50

## 2016-12-06 MED ORDER — BUDESONIDE 0.5 MG/2ML IN SUSP
0.5000 mg | Freq: Two times a day (BID) | RESPIRATORY_TRACT | Status: DC
  Administered 2016-12-06 – 2016-12-07 (×4): 0.5 mg via RESPIRATORY_TRACT
  Filled 2016-12-06 (×7): qty 2

## 2016-12-06 MED ORDER — VANCOMYCIN HCL IN DEXTROSE 1-5 GM/200ML-% IV SOLN
1000.0000 mg | INTRAVENOUS | Status: DC
  Administered 2016-12-06: 1000 mg via INTRAVENOUS
  Filled 2016-12-06 (×2): qty 200

## 2016-12-06 MED ORDER — ACETAMINOPHEN 650 MG RE SUPP: 650.0000 mg | Freq: Four times a day (QID) | RECTAL | Status: DC | PRN

## 2016-12-06 MED ORDER — ALBUTEROL SULFATE (2.5 MG/3ML) 0.083% IN NEBU: 2.5000 mg | INHALATION_SOLUTION | RESPIRATORY_TRACT | Status: DC | PRN

## 2016-12-06 MED ORDER — PIPERACILLIN-TAZOBACTAM 3.375 G IVPB 30 MIN
3.3750 g | Freq: Once | INTRAVENOUS | Status: AC
  Administered 2016-12-06: 3.375 g via INTRAVENOUS
  Filled 2016-12-06: qty 50

## 2016-12-06 MED ORDER — SODIUM CHLORIDE 0.9 % IV BOLUS (SEPSIS): 1000.0000 mL | Freq: Once | INTRAVENOUS | Status: DC

## 2016-12-06 MED ORDER — PANTOPRAZOLE SODIUM 40 MG PO TBEC
40.0000 mg | DELAYED_RELEASE_TABLET | Freq: Every day | ORAL | Status: DC
  Administered 2016-12-06 – 2016-12-09 (×4): 40 mg via ORAL
  Filled 2016-12-06 (×4): qty 1

## 2016-12-06 MED ORDER — APIXABAN 5 MG PO TABS
5.0000 mg | ORAL_TABLET | Freq: Two times a day (BID) | ORAL | Status: DC
  Administered 2016-12-06 – 2016-12-09 (×7): 5 mg via ORAL
  Filled 2016-12-06 (×8): qty 1

## 2016-12-06 MED ORDER — SODIUM CHLORIDE 0.9 % IV BOLUS (SEPSIS)
1000.0000 mL | INTRAVENOUS | Status: AC
  Administered 2016-12-06: 1000 mL via INTRAVENOUS

## 2016-12-06 MED ORDER — LORAZEPAM 2 MG/ML IJ SOLN
0.2500 mg | Freq: Once | INTRAMUSCULAR | Status: DC
  Filled 2016-12-06: qty 1

## 2016-12-06 MED ORDER — SIMETHICONE 80 MG PO CHEW
80.0000 mg | CHEWABLE_TABLET | Freq: Four times a day (QID) | ORAL | Status: DC | PRN
  Filled 2016-12-06: qty 1

## 2016-12-06 MED ORDER — SODIUM CHLORIDE 0.9 % IV SOLN
30.0000 meq | Freq: Once | INTRAVENOUS | Status: AC
  Administered 2016-12-06: 30 meq via INTRAVENOUS
  Filled 2016-12-06: qty 15

## 2016-12-06 MED ORDER — ONDANSETRON HCL 4 MG PO TABS: 4.0000 mg | ORAL_TABLET | Freq: Four times a day (QID) | ORAL | Status: DC | PRN

## 2016-12-06 MED ORDER — MAGNESIUM SULFATE 2 GM/50ML IV SOLN
2.0000 g | Freq: Once | INTRAVENOUS | Status: AC
Start: 2016-12-06 — End: 2016-12-06
  Administered 2016-12-06: 2 g via INTRAVENOUS
  Filled 2016-12-06: qty 50

## 2016-12-06 NOTE — ED Notes (Signed)
Son Carney BernWendell contact info [phone]

## 2016-12-06 NOTE — ED Notes (Signed)
Pt given water to drink per request. Family requesting update.

## 2016-12-06 NOTE — H&P (Signed)
History and Physical    Erin Waller:[id number] DOB: 16-Nov-1945 DOA: 12/05/2016  Referring MD/NP/PA: Dr. Eudelia Bunch PCP: No PCP Per Patient  Patient coming from: home via EMS    Chief Complaint: Nausea, vomiting, and diarrhea  HPI: Erin Waller is a 71 y.o. female with medical history significant of  Afib on Eliquis, COPD,  DM type II; who presents with complaints of nausea, vomiting, and diarrhea for the last day. Patient reports feeling very weak and reporting multiple loose watery bowel movements throughout the day seemingly every 10-20 minutes. She reports emesis is nonbloody. She tried drinking sips of water to keep herself hydrated. Associated symptoms include progressively worsening abdominal distention and frequent belching. Denies having any chest pain or shortness breath. Patient also notes recent death of her grandson by overdose yesterday and has not been sleeping well.   ED Course: Upon admission into the emergency department patient was seen to be afebrile, patient in atrial fibrillation heart rates into the 150, respirations 11-31, blood pressures maintained, and O2 saturations within normal limits on room air. Lab work revealed WBC 12.4, potassium 3.4 Patient was given 1 L of fluid and GI panel was sent.  Review of Systems: As per HPI otherwise 10 point review of systems negative.   Past Medical History:  Diagnosis Date  . Atrial fibrillation (HCC)   . COPD (chronic obstructive pulmonary disease) (HCC)   . Diabetes mellitus without complication (HCC)     History reviewed. No pertinent surgical history.   reports that she has quit smoking. Her smoking use included Cigarettes. She does not have any smokeless tobacco history on file. She reports that she does not drink alcohol or use drugs.  Allergies  Allergen Reactions  . Sulfa Antibiotics Rash    No family history on file.  Prior to Admissionmedications    Not on File    Physical Exam:    Constitutional:  Elderly female who appears acutely ill, but able to follow commands. Belching multiple times during exam. Vitals:   12/06/16 0630 12/06/16 0645 12/06/16 0658 12/06/16 0700  BP: 119/78 114/74 114/74 114/77  Pulse: (!) 141 103 112 111  Resp: 24 25 23 24   Temp:      TempSrc:      SpO2: 95% 96% 95% 96%  Weight:      Height:       Eyes: PERRL, lids and conjunctivae normal ENMT: Mucous membranes are moist. Posterior pharynx clear of any exudate or lesions.Normal dentition.  Neck: normal, supple, no masses, no thyromegaly Respiratory: Tachypneic with decreased aeration. mild expiratory wheezes appreciated with intermittent crackles. Normal respiratory effort. No accessory muscle use.  Cardiovascular: rregular regular with past, no murmurs / rubs / gallops. No extremity edema. 2+ pedal pulses. No carotid bruits.  Abdomen: Abdominal distention noted without tenderness, no masses palpated. No hepatosplenomegaly. Bowel sounds increased..  Musculoskeletal: no clubbing / cyanosis. No joint deformity upper and lower extremities. Good ROM, no contractures. Normal muscle tone.  Skin: no rashes, lesions, ulcers. No induration Neurologic: CN 2-12 grossly intact. Sensation intact, DTR normal. Strength 5/5 in all 4.  Psychiatric: Normal judgment and insight. Alert and oriented x 3. Normal mood.     Labs on Admission: I have personally reviewed following labs and imaging studies  CBC:  Recent Labs Lab 12/05/16 2319  WBC 12.4*  NEUTROABS 9.7*  HGB 15.2*  HCT 44.7  MCV 86.5  PLT 259   Basic Metabolic Panel:  Recent Labs Lab 12/05/16 2319 12/06/16 0612  NA 138  --   K 3.4*  --   CL 105  --   CO2 18*  --   GLUCOSE 122*  --   BUN 24*  --   CREATININE 1.47*  --   CALCIUM 10.0  --   MG  --  1.3*   GFR: Estimated Creatinine Clearance: 37.5 mL/min (by C-G formula based on SCr of 1.47 mg/dL (H)). Liver Function Tests:  Recent Labs Lab 12/05/16 2319  AST 24  ALT 27  ALKPHOS 109    BILITOT 0.5  PROT 7.3  ALBUMIN 4.5    Recent Labs Lab 12/05/16 2319  LIPASE 24   No results for input(s): AMMONIA in the last 168 hours. Coagulation Profile: No results for input(s): INR, PROTIME in the last 168 hours. Cardiac Enzymes: No results for input(s): CKTOTAL, CKMB, CKMBINDEX, TROPONINI in the last 168 hours. BNP (last 3 results) No results for input(s): PROBNP in the last 8760 hours. HbA1C: No results for input(s): HGBA1C in the last 72 hours. CBG:  Recent Labs Lab 12/05/16 2316  GLUCAP 120*   Lipid Profile: No results for input(s): CHOL, HDL, LDLCALC, TRIG, CHOLHDL, LDLDIRECT in the last 72 hours. Thyroid Function Tests:  Recent Labs  12/06/16 0612  TSH 1.028   Anemia Panel: No results for input(s): VITAMINB12, FOLATE, FERRITIN, TIBC, IRON, RETICCTPCT in the last 72 hours. Urine analysis: No results found for: COLORURINE, APPEARANCEUR, LABSPEC, PHURINE, GLUCOSEU, HGBUR, BILIRUBINUR, KETONESUR, PROTEINUR, UROBILINOGEN, NITRITE, LEUKOCYTESUR Sepsis Labs: No results found for this or any previous visit (from the past 240 hour(s)).   Radiological Exams on Admission: Dg Chest Port 1 View  Result Date: 12/06/2016 CLINICAL DATA:  Shortness of breath. EXAM: PORTABLE CHEST 1 VIEW COMPARISON:  09/22/2016 FINDINGS: Stable hyperinflation and emphysematous change. Stable cardiomediastinal contours with atherosclerosis of the aortic arch. No pulmonary edema, pleural fluid, focal airspace disease or pneumothorax. No acute osseous abnormalities. IMPRESSION: No acute abnormality.  Emphysema. Thoracic aortic atherosclerosis. Electronically Signed   By: Rubye OaksMelanie  Ehinger M.D.   On: 12/06/2016 05:41   Dg Abd Portable 1v  Result Date: 12/06/2016 CLINICAL DATA:  Abdominal distention. EXAM: PORTABLE ABDOMEN - 1 VIEW COMPARISON:  None. FINDINGS: Paucity of bowel gas. No gaseous bowel distention. No evidence of free air on single supine view. No radiopaque calculi. No acute osseous  abnormalities. IMPRESSION: Nonspecific bowel-gas pattern with generalized paucity of bowel gas. Electronically Signed   By: Rubye OaksMelanie  Ehinger M.D.   On: 12/06/2016 05:40    EKG: Independently reviewed. Atrial fibrillation  Assessment/Plan Sepsis 2/2 Unknown source: Acute. Patient presents with tachycardia and tachypnea with elevated WBC of 12.4. Lactic acid reassuring at 1.15. Unclear source and cause of patient symptoms at this time. Symptoms could all be viral in nature such as a gastroenteritis. - Admit to stepdown - Sepsis protocol was initiated - Checking chest x-ray and abdominal x-ray due to symptoms of diarrhea with abdominal distention. May warrant further investigation with CT scan. - Empiric antibiotics of vancomycin and Zosyn for now - IV fluids NS at 100 ml /hr  Atrial fibrillation with RVR: Patient unable to take nightly medications. Heart rate seen up into the 150s on admission. - Giving diltiazem IV 10 mEq 1 dose now - Continue metoprolol, flecainide, and diltiazem, Eliquis as tolerated   Hypokalemia: Patient initial potassium noted be 3.4. Suspect at least in part with patient her presistant diarrhea. - Potassium chloride 30 mEq IV - Check magnesium and replace if needed  Diarrhea: Acute. Question possibility  of viral cause. - Follow-up gastrointestinal panel and C.diff -Strict ins and outs  - Symptomatic treatment  Acute kidney injury: Patient with elevated BUN and creatinine. Baseline at this time is unknown. Suspect some aspect of dehydration with frequent diarrhea symptoms. - Hold nephrotoxic agents - Recheck BMP - IV fluids  Nausea and vomiting - Zofran prn  COPD - Budesonide and Brovana nebs  - Albuterol nebs prn   GERD - Pharmacy substitution of Protonix   DVT prophylaxis: Eliquis   Code Status: Full  Family Communication: Discussed plan of care with the patient Disposition Plan: Discharge home once medically stable Consults called: None Admission  status:  Inpatient  Clydie Braun MD [facility] Pager [phone]  If 7PM-7AM, please contact night-coverage [email or web address] Password [email or web address]  12/06/2016, 7:15 AM

## 2016-12-06 NOTE — ED Notes (Signed)
Unsuccessful attempt at giving report to 4N. 

## 2016-12-06 NOTE — ED Notes (Signed)
Pt.is very tearful her son died of an overdose .

## 2016-12-06 NOTE — Progress Notes (Addendum)
Patient was admitted overnight after midnight and H and P has been reviewed and I am in agreement with Dr. Michaelle CopasSmith's Assessment and Plan as she was seen and examined by myself this AM. Patient is a 71 year old Caucasian female who has a PMH of Atrial Fibrillation on Anticoagulation with Eliquis, COPD, DM Type 2 and other comorbids who presented with complaints of Nausea, Vomiting, Diarrhea for the last day. Was very weak and had mutiple loose watery bowel movements. Was admitted with Sepsis and Afib RVR. Was found to have Norovirus and maintained on Enteric Precautions. Will now D/C IV Abx now that she has found to have Norovirus. Potassium was low so that was repleted but Magnesium is still low so will give IV Mag Sulfate 2 grams and recheck in Am. Leukocytosis has resolved. Will continue IVF Rehydration, Zofran for N/V. Continue to Follow Monitor Closely and recheck Bloodwork in Am.

## 2016-12-06 NOTE — Progress Notes (Addendum)
1857 Unable to collect urine sample due to contamination with stool while using urine hat & BSC. Will attempt again next time patient needs to urinate.  1900 Unable to collect urine sample due to contamination with stool while using urine hat & BSC. Will attempt again next time patient needs to urinate.

## 2016-12-06 NOTE — Progress Notes (Signed)
Pharmacy Antibiotic Note  Erin Waller is a 71 y.o. female admitted on 12/05/2016 with weakness, N/V/D, chills.  Pharmacy has been consulted for Vancomycin/Zosyn dosing for rule out sepsis. WBC mildly elevated. Noted renal dysfunction.   Plan: Vancomycin 1000 mg IV q24h Zosyn 3.375G IV q8h to be infused over 4 hours Trend WBC, temp, renal function  F/U infectious work-up Drug levels as indicated  Temp (24hrs), Avg:97.3 F (36.3 C), Min:97.3 F (36.3 C), Max:97.3 F (36.3 C)   Recent Labs Lab 12/05/16 2319 12/05/16 2336  WBC 12.4*  --   CREATININE 1.47*  --   LATICACIDVEN  --  1.15    CrCl cannot be calculated (Unknown ideal weight.).    Allergies  Allergen Reactions  . Sulfa Antibiotics Rash   Erin Waller, Erin Waller 12/06/2016 5:55 AM

## 2016-12-06 NOTE — Progress Notes (Signed)
MD notified that patient missed dose of Zosyn while in ED, asked if this med should be continued or re-timed. New order rec'vd for discontinue of this therapy.

## 2016-12-06 NOTE — ED Notes (Addendum)
Positive for Norovirus. Admitting doc aware.

## 2016-12-07 LAB — COMPREHENSIVE METABOLIC PANEL
ALK PHOS: 72 U/L (ref 38–126)
ALT: 20 U/L (ref 14–54)
ANION GAP: 3 — AB (ref 5–15)
AST: 18 U/L (ref 15–41)
Albumin: 2.9 g/dL — ABNORMAL LOW (ref 3.5–5.0)
BILIRUBIN TOTAL: 0.6 mg/dL (ref 0.3–1.2)
BUN: 10 mg/dL (ref 6–20)
CALCIUM: 8.5 mg/dL — AB (ref 8.9–10.3)
CO2: 18 mmol/L — ABNORMAL LOW (ref 22–32)
Chloride: 117 mmol/L — ABNORMAL HIGH (ref 101–111)
Creatinine, Ser: 0.94 mg/dL (ref 0.44–1.00)
GFR calc non Af Amer: >60 mL/min (ref >60)
GLUCOSE: 96 mg/dL (ref 65–99)
Potassium: 3.8 mmol/L (ref 3.5–5.1)
Sodium: 138 mmol/L (ref 135–145)
TOTAL PROTEIN: 5.2 g/dL — AB (ref 6.5–8.1)

## 2016-12-07 LAB — CBC
HCT: 33.9 % — ABNORMAL LOW (ref 36.0–46.0)
Hemoglobin: 10.9 g/dL — ABNORMAL LOW (ref 12.0–15.0)
MCH: 29.1 pg (ref 26.0–34.0)
MCHC: 32.2 g/dL (ref 30.0–36.0)
MCV: 90.4 fL (ref 78.0–100.0)
PLATELETS: 175 10*3/uL (ref 150–400)
RBC: 3.75 MIL/uL — ABNORMAL LOW (ref 3.87–5.11)
RDW: 16.2 % — AB (ref 11.5–15.5)
WBC: 5.2 10*3/uL (ref 4.0–10.5)

## 2016-12-07 LAB — MAGNESIUM: Magnesium: 2.3 mg/dL (ref 1.7–2.4)

## 2016-12-07 LAB — PHOSPHORUS: Phosphorus: 2.4 mg/dL — ABNORMAL LOW (ref 2.5–4.6)

## 2016-12-07 MED ORDER — DILTIAZEM HCL ER COATED BEADS 180 MG PO CP24
180.0000 mg | ORAL_CAPSULE | Freq: Every day | ORAL | Status: DC
  Administered 2016-12-07 – 2016-12-09 (×3): 180 mg via ORAL
  Filled 2016-12-07 (×3): qty 1

## 2016-12-07 NOTE — Progress Notes (Signed)
PROGRESS NOTE    Erin Waller  WUJ:[id number] DOB: 1946/04/25 DOA: 12/05/2016 PCP: No PCP Per Patient  Brief Narrative: Erin Waller is a 72 y.o. female with medical history significant of  Afib on Eliquis, COPD,  DM type II; who presents with complaints of nausea, vomiting, and diarrhea for the last day. Patient reports feeling very weak and reporting multiple loose watery bowel movements throughout the day seemingly every 10-20 minutes. She reports emesis is nonbloody. She tried drinking sips of water to keep herself hydrated. Associated symptoms include progressively worsening abdominal distention and frequent belching. Denies having any chest pain or shortness breath. Patient also notes recent death of her grandson by overdose yesterday and has not been sleeping well. Admitted and found to be positive for Norovirus and also admitted for Atrial Fibrillation with RVR.   Assessment & Plan:   Principal Problem:   Sepsis (HCC) Active Problems:   Atrial fibrillation with RVR (HCC)   Diarrhea   Hypokalemia   COPD (chronic obstructive pulmonary disease) (HCC)   AKI (acute kidney injury) (HCC)   Norovirus  SIRS 2/2 to Norovirus - Acute. Patient presents with tachycardia and tachypnea with elevated WBC of 12.4. Lactic acid reassuring at 1.15. Symptoms appear all be viral in nature such as a gastroenteritis. - Sepsis protocol was initiated - Checking chest x-ray and abdominal x-ray due to symptoms of diarrhea with abdominal distention. May warrant further investigation with CT scan. - Procalcitonin was 0.92 - Empiric antibiotics of vancomycin and Zosyn Discontinued - C/w IV fluids NS at 100 ml /hr  Atrial fibrillation with RVR:  - Patient unable to take nightly medications on admission. Heart rate seen up into the 150s on admission. - Giving diltiazem IV 10 mEq 1 given - Continue Anticoagulation with 5 mg po BID - C/w Diltazem 180 mg po Daily, Metoprolol 25 mg po BID; Will restart  Flecanide in AM  Hypokalemia:  - Improved - Patient initial potassium noted be 3.4. Suspect at least in part with patient her presistant diarrhea. - Potassium chloride 30 mEq IV - Check magnesium and replace if needed  Hypomagnesemia: -Improved now. -Was 1.3 on Admission -Replete  Diarrhea:  - Acute. From Norovirus - Follow-up gastrointestinal panel Positive for Norovirus; C.diff Negative - Strict ins and outs  - Symptomatic treatment  Acute kidney injury:  - Improving - Patient with elevated BUN and creatinine. Suspect some aspect of dehydration with frequent diarrhea symptoms. - Hold nephrotoxic agents - Recheck BMP - C/w IV fluids at 100 mL/hr  Nausea and vomiting - Zofran prn  COPD - Budesonide and Brovana nebs  - Albuterol nebs prn   GERD - Pharmacy substitution of Protonix   DVT prophylaxis: Anticoagulated with Apixaban Code Status: FULL CODE Family Communication: No family present at bedside Disposition Plan: Remain in SDU as HR is still uncontrolled  Consultants:   None   Procedures: None  Antimicrobials:  Anti-infectives    Start     Dose/Rate Route Frequency Ordered Stop   12/06/16 1400  piperacillin-tazobactam (ZOSYN) IVPB 3.375 g  Status:  Discontinued     3.375 g 12.5 mL/hr over 240 Minutes Intravenous Every 8 hours 12/06/16 0620 12/06/16 1812   12/06/16 0615  vancomycin (VANCOCIN) IVPB 1000 mg/200 mL premix  Status:  Discontinued     1,000 mg 200 mL/hr over 60 Minutes Intravenous Every 24 hours 12/06/16 0551 12/06/16 1812   12/06/16 0600  piperacillin-tazobactam (ZOSYN) IVPB 3.375 g     3.375 g 100 mL/hr over  30 Minutes Intravenous  Once 12/06/16 0551 12/06/16 0754     Subjective: Seen and examined and was still having diarrhea but was improving. No nausea or vomiting this AM but had it last night. Very tearful and upset because her son recently passed away from Drug overdose.   Objective: Vitals:   12/07/16 0914 12/07/16 1202  12/07/16 1536 12/07/16 1600  BP:  112/67 111/69 108/83  Pulse:  (!) 140 (!) 116 (!) 115  Resp:  (!) 24 (!) 21 (!) 25  Temp:  97.9 F (36.6 C) 98.5 F (36.9 C)   TempSrc:  Oral Oral   SpO2: 96% 95% 95% 97%  Weight:      Height:        Intake/Output Summary (Last 24 hours) at 12/07/16 1845 Last data filed at 12/07/16 1800  Gross per 24 hour  Intake          4606.67 ml  Output             1700 ml  Net          2906.67 ml   Filed Weights   12/06/16 0602 12/06/16 1620  Weight: 78.9 kg (174 lb) 78.9 kg (173 lb 15.1 oz)   Examination: Physical Exam:  Constitutional: WN/WD, NAD and appears slightly tearful Eyes: Lids and conjunctivae normal, sclerae anicteric  ENMT: External Ears, Nose appear normal. Grossly normal hearing.  Neck: Appears normal, supple, no cervical masses, normal ROM, no appreciable thyromegaly, no JVD Respiratory: Diminished to auscultation bilaterally, no wheezing, rales, rhonchi or crackles. Normal respiratory effort and patient is not tachypenic. No accessory muscle use.  Cardiovascular: Irregularly Irregular with a tachycardic rate, no murmurs / rubs / gallops. S1 and S2 auscultated.  Abdomen: Soft, mildly tender, non-distended. No masses palpated. No appreciable hepatosplenomegaly. Bowel sounds positive x4.  GU: Deferred. Musculoskeletal: No clubbing / cyanosis of digits/nails. No joint deformity upper and lower extremities.  Skin: No rashes, lesions, ulcers on limited skin evaluation. No induration; Warm and dry.  Neurologic: CN 2-12 grossly intact with no focal deficits. Sensation intact in all 4 Extremities. Romberg sign cerebellar reflexes not assessed.  Psychiatric: Normal judgment and insight. Alert and oriented x 3. Normal mood and appropriate affect.   Data Reviewed: I have personally reviewed following labs and imaging studies  CBC:  Recent Labs Lab 12/05/16 2319 12/06/16 0852 12/07/16 0257  WBC 12.4* 7.6 5.2  NEUTROABS 9.7* 6.7  --   HGB  15.2* 12.1 10.9*  HCT 44.7 37.0 33.9*  MCV 86.5 88.3 90.4  PLT 259 201 175   Basic Metabolic Panel:  Recent Labs Lab 12/05/16 2319 12/06/16 0612 12/06/16 0852 12/07/16 0257  NA 138  --  139 138  K 3.4*  --  3.9 3.8  CL 105  --  112* 117*  CO2 18*  --  16* 18*  GLUCOSE 122*  --  123* 96  BUN 24*  --  21* 10  CREATININE 1.47*  --  1.29* 0.94  CALCIUM 10.0  --  8.1* 8.5*  MG  --  1.3* 1.5* 2.3  PHOS  --   --  3.3 2.4*   GFR: Estimated Creatinine Clearance: 58.7 mL/min (by C-G formula based on SCr of 0.94 mg/dL). Liver Function Tests:  Recent Labs Lab 12/05/16 2319 12/06/16 0852 12/07/16 0257  AST 24 24 18   ALT 27 20 20   ALKPHOS 109 81 72  BILITOT 0.5 0.5 0.6  PROT 7.3 5.3* 5.2*  ALBUMIN 4.5 3.1* 2.9*  Recent Labs Lab 12/05/16 2319  LIPASE 24   No results for input(s): AMMONIA in the last 168 hours. Coagulation Profile: No results for input(s): INR, PROTIME in the last 168 hours. Cardiac Enzymes: No results for input(s): CKTOTAL, CKMB, CKMBINDEX, TROPONINI in the last 168 hours. BNP (last 3 results) No results for input(s): PROBNP in the last 8760 hours. HbA1C: No results for input(s): HGBA1C in the last 72 hours. CBG:  Recent Labs Lab 12/05/16 2316  GLUCAP 120*   Lipid Profile: No results for input(s): CHOL, HDL, LDLCALC, TRIG, CHOLHDL, LDLDIRECT in the last 72 hours. Thyroid Function Tests:  Recent Labs  12/06/16 0612  TSH 1.028   Anemia Panel: No results for input(s): VITAMINB12, FOLATE, FERRITIN, TIBC, IRON, RETICCTPCT in the last 72 hours. Sepsis Labs:  Recent Labs Lab 12/05/16 2336 12/06/16 0612  PROCALCITON  --  0.92  LATICACIDVEN 1.15  --     Recent Results (from the past 240 hour(s))  Gastrointestinal Panel by PCR , Stool     Status: Abnormal   Collection Time: 12/05/16 11:20 PM  Result Value Ref Range Status   Campylobacter species NOT DETECTED NOT DETECTED Final   Plesimonas shigelloides NOT DETECTED NOT DETECTED Final     Salmonella species NOT DETECTED NOT DETECTED Final   Yersinia enterocolitica NOT DETECTED NOT DETECTED Final   Vibrio species NOT DETECTED NOT DETECTED Final   Vibrio cholerae NOT DETECTED NOT DETECTED Final   Enteroaggregative E coli (EAEC) NOT DETECTED NOT DETECTED Final   Enteropathogenic E coli (EPEC) NOT DETECTED NOT DETECTED Final   Enterotoxigenic E coli (ETEC) NOT DETECTED NOT DETECTED Final   Shiga like toxin producing E coli (STEC) NOT DETECTED NOT DETECTED Final   Shigella/Enteroinvasive E coli (EIEC) NOT DETECTED NOT DETECTED Final   Cryptosporidium NOT DETECTED NOT DETECTED Final   Cyclospora cayetanensis NOT DETECTED NOT DETECTED Final   Entamoeba histolytica NOT DETECTED NOT DETECTED Final   Giardia lamblia NOT DETECTED NOT DETECTED Final   Adenovirus F40/41 NOT DETECTED NOT DETECTED Final   Astrovirus NOT DETECTED NOT DETECTED Final   Norovirus GI/GII DETECTED (A) NOT DETECTED Final    Comment: RESULT CALLED TO, READ BACK BY AND VERIFIED WITH: Dorisann Frames @ 1345 12/06/16 by TCH    Rotavirus A NOT DETECTED NOT DETECTED Final   Sapovirus (I, II, IV, and V) NOT DETECTED NOT DETECTED Final  C difficile quick scan w PCR reflex     Status: None   Collection Time: 12/05/16 11:20 PM  Result Value Ref Range Status   C Diff antigen NEGATIVE NEGATIVE Final   C Diff toxin NEGATIVE NEGATIVE Final   C Diff interpretation No C. difficile detected.  Final  Culture, blood (x 2)     Status: None (Preliminary result)   Collection Time: 12/05/16 11:32 PM  Result Value Ref Range Status   Specimen Description BLOOD LEFT ARM  Final   Special Requests IN PEDIATRIC BOTTLE 4CC  Final   Culture NO GROWTH 1 DAY  Final   Report Status PENDING  Incomplete  Culture, blood (x 2)     Status: None (Preliminary result)   Collection Time: 12/06/16  6:07 AM  Result Value Ref Range Status   Specimen Description BLOOD RIGHT HAND  Final   Special Requests IN PEDIATRIC BOTTLE 3CC  Final   Culture NO  GROWTH 1 DAY  Final   Report Status PENDING  Incomplete  MRSA PCR Screening     Status: None   Collection  Time: 12/06/16  5:54 PM  Result Value Ref Range Status   MRSA by PCR NEGATIVE NEGATIVE Final    Comment:        The GeneXpert MRSA Assay (FDA approved for NASAL specimens only), is one component of a comprehensive MRSA colonization surveillance program. It is not intended to diagnose MRSA infection nor to guide or monitor treatment for MRSA infections.     Radiology Studies: Dg Chest Port 1 View  Result Date: 12/06/2016 CLINICAL DATA:  Shortness of breath. EXAM: PORTABLE CHEST 1 VIEW COMPARISON:  09/22/2016 FINDINGS: Stable hyperinflation and emphysematous change. Stable cardiomediastinal contours with atherosclerosis of the aortic arch. No pulmonary edema, pleural fluid, focal airspace disease or pneumothorax. No acute osseous abnormalities. IMPRESSION: No acute abnormality.  Emphysema. Thoracic aortic atherosclerosis. Electronically Signed   By: Rubye Oaks M.D.   On: 12/06/2016 05:41   Dg Abd Portable 1v  Result Date: 12/06/2016 CLINICAL DATA:  Abdominal distention. EXAM: PORTABLE ABDOMEN - 1 VIEW COMPARISON:  None. FINDINGS: Paucity of bowel gas. No gaseous bowel distention. No evidence of free air on single supine view. No radiopaque calculi. No acute osseous abnormalities. IMPRESSION: Nonspecific bowel-gas pattern with generalized paucity of bowel gas. Electronically Signed   By: Rubye Oaks M.D.   On: 12/06/2016 05:40   Scheduled Meds: . apixaban  5 mg Oral BID  . arformoterol  15 mcg Nebulization BID  . budesonide (PULMICORT) nebulizer solution  0.5 mg Nebulization BID  . diltiazem  180 mg Oral Daily  . LORazepam  0.25 mg Intravenous Once  . metoprolol tartrate  25 mg Oral BID  . pantoprazole  40 mg Oral Daily   Continuous Infusions: . sodium chloride 100 mL/hr at 12/07/16 0622    LOS: 1 day   Merlene Laughter, DO [facility] Pager  [phone]  If 7PM-7AM, please contact night-coverage [email or web address] Password [email or web address] 12/07/2016, 6:45 PM

## 2016-12-08 LAB — COMPREHENSIVE METABOLIC PANEL
ALT: 22 U/L (ref 14–54)
ANION GAP: 8 (ref 5–15)
AST: 18 U/L (ref 15–41)
Albumin: 2.8 g/dL — ABNORMAL LOW (ref 3.5–5.0)
Alkaline Phosphatase: 70 U/L (ref 38–126)
BUN: 6 mg/dL (ref 6–20)
CHLORIDE: 112 mmol/L — AB (ref 101–111)
CO2: 19 mmol/L — AB (ref 22–32)
Calcium: 8.7 mg/dL — ABNORMAL LOW (ref 8.9–10.3)
Creatinine, Ser: 0.84 mg/dL (ref 0.44–1.00)
GFR calc Af Amer: >60 mL/min (ref >60)
GFR calc non Af Amer: >60 mL/min (ref >60)
Glucose, Bld: 92 mg/dL (ref 65–99)
Potassium: 4 mmol/L (ref 3.5–5.1)
SODIUM: 139 mmol/L (ref 135–145)
Total Bilirubin: 0.3 mg/dL (ref 0.3–1.2)
Total Protein: 5.2 g/dL — ABNORMAL LOW (ref 6.5–8.1)

## 2016-12-08 LAB — CBC WITH DIFFERENTIAL/PLATELET
Basophils Absolute: 0 10*3/uL (ref 0.0–0.1)
Basophils Relative: 0 %
EOS ABS: 0.2 10*3/uL (ref 0.0–0.7)
EOS PCT: 3 %
HCT: 35 % — ABNORMAL LOW (ref 36.0–46.0)
Hemoglobin: 11.3 g/dL — ABNORMAL LOW (ref 12.0–15.0)
LYMPHS ABS: 2.2 10*3/uL (ref 0.7–4.0)
Lymphocytes Relative: 29 %
MCH: 28.7 pg (ref 26.0–34.0)
MCHC: 32.3 g/dL (ref 30.0–36.0)
MCV: 88.8 fL (ref 78.0–100.0)
MONO ABS: 0.6 10*3/uL (ref 0.1–1.0)
MONOS PCT: 8 %
Neutro Abs: 4.5 10*3/uL (ref 1.7–7.7)
Neutrophils Relative %: 60 %
PLATELETS: 185 10*3/uL (ref 150–400)
RBC: 3.94 MIL/uL (ref 3.87–5.11)
RDW: 15.7 % — AB (ref 11.5–15.5)
WBC: 7.6 10*3/uL (ref 4.0–10.5)

## 2016-12-08 LAB — MAGNESIUM: Magnesium: 1.6 mg/dL — ABNORMAL LOW (ref 1.7–2.4)

## 2016-12-08 LAB — PHOSPHORUS: PHOSPHORUS: 2.7 mg/dL (ref 2.5–4.6)

## 2016-12-08 MED ORDER — FERROUS SULFATE 325 (65 FE) MG PO TABS
325.0000 mg | ORAL_TABLET | Freq: Every day | ORAL | Status: DC
Start: 2016-12-08 — End: 2016-12-09
  Administered 2016-12-08 – 2016-12-09 (×2): 325 mg via ORAL
  Filled 2016-12-08 (×2): qty 1

## 2016-12-08 MED ORDER — LEVALBUTEROL HCL 0.63 MG/3ML IN NEBU: 0.6300 mg | INHALATION_SOLUTION | RESPIRATORY_TRACT | Status: DC | PRN

## 2016-12-08 MED ORDER — COLCHICINE 0.6 MG PO TABS
0.6000 mg | ORAL_TABLET | Freq: Two times a day (BID) | ORAL | Status: DC
  Administered 2016-12-08 – 2016-12-09 (×3): 0.6 mg via ORAL
  Filled 2016-12-08 (×3): qty 1

## 2016-12-08 MED ORDER — MAGNESIUM OXIDE 400 (241.3 MG) MG PO TABS
400.0000 mg | ORAL_TABLET | Freq: Two times a day (BID) | ORAL | Status: DC
  Administered 2016-12-09 (×2): 400 mg via ORAL
  Filled 2016-12-08 (×2): qty 1

## 2016-12-08 MED ORDER — FLECAINIDE ACETATE 50 MG PO TABS
25.0000 mg | ORAL_TABLET | Freq: Two times a day (BID) | ORAL | Status: DC
Start: 2016-12-08 — End: 2016-12-09
  Administered 2016-12-08 – 2016-12-09 (×3): 25 mg via ORAL
  Filled 2016-12-08 (×3): qty 1

## 2016-12-08 MED ORDER — FUROSEMIDE 10 MG/ML IJ SOLN
40.0000 mg | Freq: Once | INTRAMUSCULAR | Status: AC
  Administered 2016-12-08: 40 mg via INTRAVENOUS
  Filled 2016-12-08: qty 4

## 2016-12-08 MED ORDER — LEVALBUTEROL HCL 0.63 MG/3ML IN NEBU: 0.6300 mg | INHALATION_SOLUTION | Freq: Three times a day (TID) | RESPIRATORY_TRACT | Status: DC

## 2016-12-08 NOTE — Progress Notes (Signed)
Report called to [id number]. Preparing to transport, all personal items accounted for.

## 2016-12-08 NOTE — Progress Notes (Addendum)
PROGRESS NOTE    Erin Waller  ZOX:[id number] DOB: Sep 22, 1946 DOA: 12/05/2016 PCP: No PCP Per Patient  Brief Narrative: Erin Waller is a 71 y.o. female with medical history significant of  Afib on Eliquis, COPD,  DM type II; who presents with complaints of nausea, vomiting, and diarrhea for the last day. Patient reports feeling very weak and reporting multiple loose watery bowel movements throughout the day seemingly every 10-20 minutes. She reports emesis is nonbloody. She tried drinking sips of water to keep herself hydrated. Associated symptoms include progressively worsening abdominal distention and frequent belching. Denies having any chest pain or shortness breath. Patient also notes recent death of her grandson by overdose yesterday and has not been sleeping well. Admitted and found to be positive for Norovirus and also admitted for Atrial Fibrillation with RVR. Appeared a little volume overloaded today so IVF was discontinued and given IV Lasix.  Assessment & Plan:   Principal Problem:   Sepsis (HCC) Active Problems:   Atrial fibrillation with RVR (HCC)   Diarrhea   Hypokalemia   COPD (chronic obstructive pulmonary disease) (HCC)   AKI (acute kidney injury) (HCC)   Norovirus  SIRS 2/2 to Norovirus - Acute. Patient presents with tachycardia and tachypnea with elevated WBC of 12.4. Lactic acid reassuring at 1.15. Symptoms appear all be viral in nature such as a gastroenteritis. - Sepsis protocol was initiated - Checking chest x-ray and abdominal x-ray due to symptoms of diarrhea with abdominal distention. May warrant further investigation with CT scan. - Procalcitonin was 0.92 - Empiric antibiotics of vancomycin and Zosyn Discontinued - D/C'd IV fluids NS at 100 ml /hr as she appeared slightly volume overloaded - States Diarrhea is resolving and more formed.   Atrial fibrillation with RVR:  - Patient unable to take nightly medications on admission. Heart rate seen up into the  150s on admission. - Giving diltiazem IV 10 mEq 1 given - Continue Anticoagulation with 5 mg po BID - C/w Diltazem 180 mg po Daily, Metoprolol 25 mg po BID - Restarted Flecanide 25 mg po BID  Hypokalemia:  - Improved and now 4.0 - Patient initial potassium noted be 3.4. Suspect at least in part with patient her presistant diarrhea. - Potassium chloride 30 mEq IV - Check magnesium and replace if needed  Hypomagnesemia: -Was 1.6 today;  -Was 1.3 on Admission -Replete with po Mag Oxide 400 mg po BID -Repeat CBC in AM  Diarrhea:  - Improving  - Acute. From Norovirus - Follow-up gastrointestinal panel Positive for Norovirus; C.diff Negative - Strict ins and outs  - Symptomatic treatment  Acute kidney injury:  - Improved  - Patient with elevated BUN and creatinine. Suspect some aspect of dehydration with frequent diarrhea symptoms. - Hold nephrotoxic agents - Recheck BMP - D/C'd IV fluids at 100 mL/hr  Nausea and vomiting - Zofran prn  COPD - Budesonide and Brovana nebs  - Albuterol nebs prn  - Added Xopenex 0.63 mg q4hprn for Wheezing SOB  Gout -Restarted Home Colchicine 0.6 mg po BID  GERD - Pharmacy substitution of Protonix; C/w 40 mg po Daily  DVT prophylaxis: Anticoagulated with Apixaban Code Status: FULL CODE Family Communication: No family present at bedside Disposition Plan: Transfer to Telemetry  Consultants:   None   Procedures: None  Antimicrobials:  Anti-infectives    Start     Dose/Rate Route Frequency Ordered Stop   12/06/16 1400  piperacillin-tazobactam (ZOSYN) IVPB 3.375 g  Status:  Discontinued     3.375  g 12.5 mL/hr over 240 Minutes Intravenous Every 8 hours 12/06/16 0620 12/06/16 1812   12/06/16 0615  vancomycin (VANCOCIN) IVPB 1000 mg/200 mL premix  Status:  Discontinued     1,000 mg 200 mL/hr over 60 Minutes Intravenous Every 24 hours 12/06/16 0551 12/06/16 1812   12/06/16 0600  piperacillin-tazobactam (ZOSYN) IVPB 3.375 g      3.375 g 100 mL/hr over 30 Minutes Intravenous  Once 12/06/16 0551 12/06/16 0754     Subjective: Seen and examined and stated diarrhea was improving and bowel movements were more formed. States she felt puffy and had a little SOB. Fluids were D/C'd. No other complaints or concerns and thinks she is getting better.   Objective: Vitals:   12/08/16 1539 12/08/16 1540 12/08/16 1557 12/08/16 1600  BP:      Pulse: (!) 104 99 99 90  Resp: (!) 24 (!) 24 (!) 28 (!) 26  Temp:      TempSrc:      SpO2: 97% 99% 97% 97%  Weight:      Height:        Intake/Output Summary (Last 24 hours) at 12/08/16 1841 Last data filed at 12/08/16 1400  Gross per 24 hour  Intake             2310 ml  Output              400 ml  Net             1910 ml   Filed Weights   12/06/16 0602 12/06/16 1620  Weight: 78.9 kg (174 lb) 78.9 kg (173 lb 15.1 oz)   Examination: Physical Exam:  Constitutional: WN/WD, NAD and appears calm and comfortable Eyes: Lids and conjunctivae normal, sclerae anicteric  ENMT: External Ears, Nose appear normal. Grossly normal hearing.  Neck: Appears normal, supple, no cervical masses, normal ROM, no appreciable thyromegaly, no JVD Respiratory: Diminished to auscultation bilaterally, no wheezing, rales, rhonchi or crackles. Normal respiratory effort and patient is slightly tachypenic. No accessory muscle use.  Cardiovascular: Irregularly Irregular with a tachycardic rate, no murmurs / rubs / gallops. S1 and S2 auscultated. Mild edema noted.  Abdomen: Soft, mildly tender, non-distended. No masses palpated. No appreciable hepatosplenomegaly. Bowel sounds positive x4.  GU: Deferred. Musculoskeletal: No clubbing / cyanosis of digits/nails. No joint deformity upper and lower extremities.  Skin: No rashes, lesions, ulcers on limited skin evaluation. No induration; Warm and dry.  Neurologic: CN 2-12 grossly intact with no focal deficits. Sensation intact in all 4 Extremities. Romberg sign  cerebellar reflexes not assessed.  Psychiatric: Normal judgment and insight. Alert and oriented x 3. Normal mood and appropriate affect.   Data Reviewed: I have personally reviewed following labs and imaging studies  CBC:  Recent Labs Lab 12/05/16 2319 12/06/16 0852 12/07/16 0257 12/08/16 0253  WBC 12.4* 7.6 5.2 7.6  NEUTROABS 9.7* 6.7  --  4.5  HGB 15.2* 12.1 10.9* 11.3*  HCT 44.7 37.0 33.9* 35.0*  MCV 86.5 88.3 90.4 88.8  PLT 259 201 175 185   Basic Metabolic Panel:  Recent Labs Lab 12/05/16 2319 12/06/16 0612 12/06/16 0852 12/07/16 0257 12/08/16 0253  NA 138  --  139 138 139  K 3.4*  --  3.9 3.8 4.0  CL 105  --  112* 117* 112*  CO2 18*  --  16* 18* 19*  GLUCOSE 122*  --  123* 96 92  BUN 24*  --  21* 10 6  CREATININE 1.47*  --  1.29* 0.94 0.84  CALCIUM 10.0  --  8.1* 8.5* 8.7*  MG  --  1.3* 1.5* 2.3 1.6*  PHOS  --   --  3.3 2.4* 2.7   GFR: Estimated Creatinine Clearance: 65.7 mL/min (by C-G formula based on SCr of 0.84 mg/dL). Liver Function Tests:  Recent Labs Lab 12/05/16 2319 12/06/16 0852 12/07/16 0257 12/08/16 0253  AST 24 24 18 18   ALT 27 20 20 22   ALKPHOS 109 81 72 70  BILITOT 0.5 0.5 0.6 0.3  PROT 7.3 5.3* 5.2* 5.2*  ALBUMIN 4.5 3.1* 2.9* 2.8*    Recent Labs Lab 12/05/16 2319  LIPASE 24   No results for input(s): AMMONIA in the last 168 hours. Coagulation Profile: No results for input(s): INR, PROTIME in the last 168 hours. Cardiac Enzymes: No results for input(s): CKTOTAL, CKMB, CKMBINDEX, TROPONINI in the last 168 hours. BNP (last 3 results) No results for input(s): PROBNP in the last 8760 hours. HbA1C: No results for input(s): HGBA1C in the last 72 hours. CBG:  Recent Labs Lab 12/05/16 2316  GLUCAP 120*   Lipid Profile: No results for input(s): CHOL, HDL, LDLCALC, TRIG, CHOLHDL, LDLDIRECT in the last 72 hours. Thyroid Function Tests:  Recent Labs  12/06/16 0612  TSH 1.028   Anemia Panel: No results for input(s):  VITAMINB12, FOLATE, FERRITIN, TIBC, IRON, RETICCTPCT in the last 72 hours. Sepsis Labs:  Recent Labs Lab 12/05/16 2336 12/06/16 0612  PROCALCITON  --  0.92  LATICACIDVEN 1.15  --     Recent Results (from the past 240 hour(s))  Gastrointestinal Panel by PCR , Stool     Status: Abnormal   Collection Time: 12/05/16 11:20 PM  Result Value Ref Range Status   Campylobacter species NOT DETECTED NOT DETECTED Final   Plesimonas shigelloides NOT DETECTED NOT DETECTED Final   Salmonella species NOT DETECTED NOT DETECTED Final   Yersinia enterocolitica NOT DETECTED NOT DETECTED Final   Vibrio species NOT DETECTED NOT DETECTED Final   Vibrio cholerae NOT DETECTED NOT DETECTED Final   Enteroaggregative E coli (EAEC) NOT DETECTED NOT DETECTED Final   Enteropathogenic E coli (EPEC) NOT DETECTED NOT DETECTED Final   Enterotoxigenic E coli (ETEC) NOT DETECTED NOT DETECTED Final   Shiga like toxin producing E coli (STEC) NOT DETECTED NOT DETECTED Final   Shigella/Enteroinvasive E coli (EIEC) NOT DETECTED NOT DETECTED Final   Cryptosporidium NOT DETECTED NOT DETECTED Final   Cyclospora cayetanensis NOT DETECTED NOT DETECTED Final   Entamoeba histolytica NOT DETECTED NOT DETECTED Final   Giardia lamblia NOT DETECTED NOT DETECTED Final   Adenovirus F40/41 NOT DETECTED NOT DETECTED Final   Astrovirus NOT DETECTED NOT DETECTED Final   Norovirus GI/GII DETECTED (A) NOT DETECTED Final    Comment: RESULT CALLED TO, READ BACK BY AND VERIFIED WITH: Dorisann Frames @ 1345 12/06/16 by TCH    Rotavirus A NOT DETECTED NOT DETECTED Final   Sapovirus (I, II, IV, and V) NOT DETECTED NOT DETECTED Final  C difficile quick scan w PCR reflex     Status: None   Collection Time: 12/05/16 11:20 PM  Result Value Ref Range Status   C Diff antigen NEGATIVE NEGATIVE Final   C Diff toxin NEGATIVE NEGATIVE Final   C Diff interpretation No C. difficile detected.  Final  Culture, blood (x 2)     Status: None (Preliminary  result)   Collection Time: 12/05/16 11:32 PM  Result Value Ref Range Status   Specimen Description BLOOD LEFT ARM  Final   Special Requests IN PEDIATRIC BOTTLE 4CC  Final   Culture NO GROWTH 2 DAYS  Final   Report Status PENDING  Incomplete  Culture, blood (x 2)     Status: None (Preliminary result)   Collection Time: 12/06/16  6:07 AM  Result Value Ref Range Status   Specimen Description BLOOD RIGHT HAND  Final   Special Requests IN PEDIATRIC BOTTLE 3CC  Final   Culture NO GROWTH 2 DAYS  Final   Report Status PENDING  Incomplete  MRSA PCR Screening     Status: None   Collection Time: 12/06/16  5:54 PM  Result Value Ref Range Status   MRSA by PCR NEGATIVE NEGATIVE Final    Comment:        The GeneXpert MRSA Assay (FDA approved for NASAL specimens only), is one component of a comprehensive MRSA colonization surveillance program. It is not intended to diagnose MRSA infection nor to guide or monitor treatment for MRSA infections.     Radiology Studies: No results found. Scheduled Meds: . apixaban  5 mg Oral BID  . arformoterol  15 mcg Nebulization BID  . budesonide (PULMICORT) nebulizer solution  0.5 mg Nebulization BID  . colchicine  0.6 mg Oral BID  . diltiazem  180 mg Oral Daily  . ferrous sulfate  325 mg Oral Q breakfast  . flecainide  25 mg Oral BID  . LORazepam  0.25 mg Intravenous Once  . metoprolol tartrate  25 mg Oral BID  . pantoprazole  40 mg Oral Daily   Continuous Infusions:   LOS: 2 days   Merlene Laughtermair Latif Reino Lybbert, DO [facility] Pager [phone]  If 7PM-7AM, please contact night-coverage [email or web address] Password [email or web address] 12/08/2016, 6:41 PM

## 2016-12-08 NOTE — Care Management Note (Signed)
Case Management Note  Patient Details  Name: Erin Waller MRN: [id number] Date of Birth: 07/12/1946  Subjective/Objective:       Adm w sepsis, norovirus             Action/Plan: from home w family   Expected Discharge Date:                  Expected Discharge Plan:  Home/Self Care  In-House Referral:     Discharge planning Services     Post Acute Care Choice:    Choice offered to:     DME Arranged:    DME Agency:     HH Arranged:    HH Agency:     Status of Service:  In process, will continue to follow  If discussed at Long Length of Stay Meetings, dates discussed:    Additional Comments:cont to follow for dc needs non anticipated.  Hanley Haysowell, Haadiya Frogge T, RN 12/08/2016, 8:56 AM

## 2016-12-09 DIAGNOSIS — N179 Acute kidney failure, unspecified: Secondary | ICD-10-CM

## 2016-12-09 DIAGNOSIS — J449 Chronic obstructive pulmonary disease, unspecified: Secondary | ICD-10-CM

## 2016-12-09 DIAGNOSIS — R6510 Systemic inflammatory response syndrome (SIRS) of non-infectious origin without acute organ dysfunction: Secondary | ICD-10-CM

## 2016-12-09 DIAGNOSIS — I4891 Unspecified atrial fibrillation: Secondary | ICD-10-CM

## 2016-12-09 NOTE — Discharge Summary (Signed)
PATIENT DETAILS Name: Erin Waller Age: 71 y.o. Sex: female Date of Birth: 10/12/1946 MRN: [id number]. Admitting Physician: Clydie Braun, MD PCP:No PCP Per Patient  Admit Date: 12/05/2016 Discharge date: 12/09/2016  Recommendations for Outpatient Follow-up:  1. Follow up with PCP in 1-2 weeks 2. Please obtain BMP/CBC in one week  Admitted From:  Home  Disposition: Home   Home Health: No  Equipment/Devices: None  Discharge Condition: Stable  CODE STATUS: FULL CODE  Diet recommendation:  Heart Healthy  Brief Summary: See H&P, Labs, Consult and Test reports for all details in brief, patient is a 71 year old female with history of atrial fibrillation on anticoagulation, COPD, who presented to the hospital with nausea, vomiting and diarrhea. She was found to have A. fib with RVR in the emergency room. She was subsequently admitted for further evaluation and treatment, see below for further details.  Brief Hospital Course: Systemic inflammatory response syndrome secondary to Norvo virus: SIRS pathophysiology has resolved, her diarrhea has improved with supportive measures. Her abdomen is soft and nontender, no further imaging is warranted. She is tolerating regular diet and is anxious to be discharged home today.  Atrial fibrillation with RVR: This occurred on admission, currently her rate is well controlled. Patient claims she has chronic atrial fibrillation. She follows with her primary cardiologist in [place]-plans are to continue with Cardizem, metoprolol, flecainide and usual anticoagulation with Eliquis  Hypokalemia/hypomagnesemia: Secondary to GI loss, resolved.  Acute kidney injury: Likely mild hemodynamically mediated acute kidney injury due to diarrhea. Resolved with supportive measures.  COPD: Stable, continue usual  Procedures/Studies: None  Discharge Diagnoses:  Principal Problem:   Sepsis (HCC) Active Problems:   Atrial fibrillation with RVR  (HCC)   Diarrhea   Hypokalemia   COPD (chronic obstructive pulmonary disease) (HCC)   AKI (acute kidney injury) (HCC)   Norovirus   Discharge Instructions:  Activity:  As tolerated with Full fall precautions use walker/cane & assistance as needed  Discharge Instructions    Call MD for:  persistant nausea and vomiting    Complete by:  As directed    Diet - low sodium heart healthy    Complete by:  As directed    Increase activity slowly    Complete by:  As directed      Allergies as of 12/09/2016      Reactions   Sulfa Antibiotics Rash      Medication List    TAKE these medications   budesonide-formoterol 160-4.5 MCG/ACT inhaler Commonly known as:  SYMBICORT Inhale 2 puffs into the lungs 2 (two) times daily.   colchicine 0.6 MG tablet Take 0.6 mg by mouth 2 (two) times daily.   diltiazem 180 MG 24 hr capsule Commonly known as:  CARDIZEM CD Take 180 mg by mouth daily.   ELIQUIS 5 MG Tabs tablet Generic drug:  apixaban Take 5 mg by mouth 2 (two) times daily.   ferrous sulfate 325 (65 FE) MG tablet Take 325 mg by mouth daily with breakfast.   flecainide 50 MG tablet Commonly known as:  TAMBOCOR Take 25 mg by mouth 2 (two) times daily.   furosemide 20 MG tablet Commonly known as:  LASIX Take 20 mg by mouth daily.   lansoprazole 30 MG capsule Commonly known as:  PREVACID Take 30 mg by mouth daily at 12 noon.   metoprolol tartrate 25 MG tablet Commonly known as:  LOPRESSOR Take 25 mg by mouth 2 (two) times daily.   nitroGLYCERIN 0.4 MG/SPRAY spray  Commonly known as:  NITROLINGUAL Place 1 spray under the tongue every 5 (five) minutes x 3 doses as needed for chest pain.   spironolactone 25 MG tablet Commonly known as:  ALDACTONE Take 25 mg by mouth daily.      Follow-up Information    Sandy Salaam, MD. Schedule an appointment as soon as possible for a visit in 1 week(s).   Specialty:  Cardiology Contact information: [address] [place] [address] [phone]        Primary MD. Schedule an appointment as soon as possible for a visit in 2 week(s).          Allergies  Allergen Reactions  . Sulfa Antibiotics Rash    Consultations:   None  Other Procedures/Studies: Dg Chest Port 1 View  Result Date: 12/06/2016 CLINICAL DATA:  Shortness of breath. EXAM: PORTABLE CHEST 1 VIEW COMPARISON:  09/22/2016 FINDINGS: Stable hyperinflation and emphysematous change. Stable cardiomediastinal contours with atherosclerosis of the aortic arch. No pulmonary edema, pleural fluid, focal airspace disease or pneumothorax. No acute osseous abnormalities. IMPRESSION: No acute abnormality.  Emphysema. Thoracic aortic atherosclerosis. Electronically Signed   By: Rubye Oaks M.D.   On: 12/06/2016 05:41   Dg Abd Portable 1v  Result Date: 12/06/2016 CLINICAL DATA:  Abdominal distention. EXAM: PORTABLE ABDOMEN - 1 VIEW COMPARISON:  None. FINDINGS: Paucity of bowel gas. No gaseous bowel distention. No evidence of free air on single supine view. No radiopaque calculi. No acute osseous abnormalities. IMPRESSION: Nonspecific bowel-gas pattern with generalized paucity of bowel gas. Electronically Signed   By: Rubye Oaks M.D.   On: 12/06/2016 05:40      TODAY-DAY OF DISCHARGE:  Subjective:   Erin Waller today has no headache,no chest abdominal pain,no new weakness tingling or numbness, feels much better wants to go home today.  Objective:   Blood pressure 105/78, pulse 92, temperature 97.1 F (36.2 C), temperature source Oral, resp. rate (!) 26, height 5\' 5"  (1.651 m), weight 78.9 kg (173 lb 15.1 oz), SpO2 96 %.  Intake/Output Summary (Last 24 hours) at 12/09/16 1301 Last data filed at 12/09/16 0900  Gross per 24 hour  Intake              480 ml  Output              800 ml  Net             -320 ml   Filed Weights   12/06/16 0602 12/06/16 1620  Weight: 78.9 kg (174 lb) 78.9 kg (173 lb 15.1 oz)     Exam: Awake Alert, Oriented *3, No new F.N deficits, Normal affect [place].AT,PERRAL Supple Neck,No JVD, No cervical lymphadenopathy appriciated.  Symmetrical Chest wall movement, Good air movement bilaterally, CTAB RRR,No Gallops,Rubs or new Murmurs, No Parasternal Heave +ve B.Sounds, Abd Soft, Non tender, No organomegaly appriciated, No rebound -guarding or rigidity. No Cyanosis, Clubbing or edema, No new Rash or bruise   PERTINENT RADIOLOGIC STUDIES: Dg Chest Port 1 View  Result Date: 12/06/2016 CLINICAL DATA:  Shortness of breath. EXAM: PORTABLE CHEST 1 VIEW COMPARISON:  09/22/2016 FINDINGS: Stable hyperinflation and emphysematous change. Stable cardiomediastinal contours with atherosclerosis of the aortic arch. No pulmonary edema, pleural fluid, focal airspace disease or pneumothorax. No acute osseous abnormalities. IMPRESSION: No acute abnormality.  Emphysema. Thoracic aortic atherosclerosis. Electronically Signed   By: Rubye Oaks M.D.   On: 12/06/2016 05:41   Dg Abd Portable 1v  Result Date: 12/06/2016 CLINICAL  DATA:  Abdominal distention. EXAM: PORTABLE ABDOMEN - 1 VIEW COMPARISON:  None. FINDINGS: Paucity of bowel gas. No gaseous bowel distention. No evidence of free air on single supine view. No radiopaque calculi. No acute osseous abnormalities. IMPRESSION: Nonspecific bowel-gas pattern with generalized paucity of bowel gas. Electronically Signed   By: Rubye Oaks M.D.   On: 12/06/2016 05:40     PERTINENT LAB RESULTS: CBC:  Recent Labs  12/07/16 0257 12/08/16 0253  WBC 5.2 7.6  HGB 10.9* 11.3*  HCT 33.9* 35.0*  PLT 175 185   CMET CMP     Component Value Date/Time   NA 139 12/08/2016 0253   K 4.0 12/08/2016 0253   CL 112 (H) 12/08/2016 0253   CO2 19 (L) 12/08/2016 0253   GLUCOSE 92 12/08/2016 0253   BUN 6 12/08/2016 0253   CREATININE 0.84 12/08/2016 0253   CALCIUM 8.7 (L) 12/08/2016 0253   PROT 5.2 (L) 12/08/2016 0253   ALBUMIN 2.8 (L) 12/08/2016 0253    AST 18 12/08/2016 0253   ALT 22 12/08/2016 0253   ALKPHOS 70 12/08/2016 0253   BILITOT 0.3 12/08/2016 0253   GFRNONAA >60 12/08/2016 0253   GFRAA >60 12/08/2016 0253    GFR Estimated Creatinine Clearance: 65.7 mL/min (by C-G formula based on SCr of 0.84 mg/dL). No results for input(s): LIPASE, AMYLASE in the last 72 hours. No results for input(s): CKTOTAL, CKMB, CKMBINDEX, TROPONINI in the last 72 hours. Invalid input(s): POCBNP No results for input(s): DDIMER in the last 72 hours. No results for input(s): HGBA1C in the last 72 hours. No results for input(s): CHOL, HDL, LDLCALC, TRIG, CHOLHDL, LDLDIRECT in the last 72 hours. No results for input(s): TSH, T4TOTAL, T3FREE, THYROIDAB in the last 72 hours.  Invalid input(s): FREET3 No results for input(s): VITAMINB12, FOLATE, FERRITIN, TIBC, IRON, RETICCTPCT in the last 72 hours. Coags: No results for input(s): INR in the last 72 hours.  Invalid input(s): PT Microbiology: Recent Results (from the past 240 hour(s))  Gastrointestinal Panel by PCR , Stool     Status: Abnormal   Collection Time: 12/05/16 11:20 PM  Result Value Ref Range Status   Campylobacter species NOT DETECTED NOT DETECTED Final   Plesimonas shigelloides NOT DETECTED NOT DETECTED Final   Salmonella species NOT DETECTED NOT DETECTED Final   Yersinia enterocolitica NOT DETECTED NOT DETECTED Final   Vibrio species NOT DETECTED NOT DETECTED Final   Vibrio cholerae NOT DETECTED NOT DETECTED Final   Enteroaggregative E coli (EAEC) NOT DETECTED NOT DETECTED Final   Enteropathogenic E coli (EPEC) NOT DETECTED NOT DETECTED Final   Enterotoxigenic E coli (ETEC) NOT DETECTED NOT DETECTED Final   Shiga like toxin producing E coli (STEC) NOT DETECTED NOT DETECTED Final   Shigella/Enteroinvasive E coli (EIEC) NOT DETECTED NOT DETECTED Final   Cryptosporidium NOT DETECTED NOT DETECTED Final   Cyclospora cayetanensis NOT DETECTED NOT DETECTED Final   Entamoeba histolytica NOT  DETECTED NOT DETECTED Final   Giardia lamblia NOT DETECTED NOT DETECTED Final   Adenovirus F40/41 NOT DETECTED NOT DETECTED Final   Astrovirus NOT DETECTED NOT DETECTED Final   Norovirus GI/GII DETECTED (A) NOT DETECTED Final    Comment: RESULT CALLED TO, READ BACK BY AND VERIFIED WITH: Dorisann Frames @ 1345 12/06/16 by T[facility]    Rotavirus A NOT DETECTED NOT DETECTED Final   Sapovirus (I, II, IV, and V) NOT DETECTED NOT DETECTED Final  C difficile quick scan w PCR reflex     Status: None  Collection Time: 12/05/16 11:20 PM  Result Value Ref Range Status   C Diff antigen NEGATIVE NEGATIVE Final   C Diff toxin NEGATIVE NEGATIVE Final   C Diff interpretation No C. difficile detected.  Final  Culture, blood (x 2)     Status: None (Preliminary result)   Collection Time: 12/05/16 11:32 PM  Result Value Ref Range Status   Specimen Description BLOOD LEFT ARM  Final   Special Requests IN PEDIATRIC BOTTLE 4CC  Final   Culture NO GROWTH 2 DAYS  Final   Report Status PENDING  Incomplete  Culture, blood (x 2)     Status: None (Preliminary result)   Collection Time: 12/06/16  6:07 AM  Result Value Ref Range Status   Specimen Description BLOOD RIGHT HAND  Final   Special Requests IN PEDIATRIC BOTTLE 3CC  Final   Culture NO GROWTH 2 DAYS  Final   Report Status PENDING  Incomplete  MRSA PCR Screening     Status: None   Collection Time: 12/06/16  5:54 PM  Result Value Ref Range Status   MRSA by PCR NEGATIVE NEGATIVE Final    Comment:        The GeneXpert MRSA Assay (FDA approved for NASAL specimens only), is one component of a comprehensive MRSA colonization surveillance program. It is not intended to diagnose MRSA infection nor to guide or monitor treatment for MRSA infections.     FURTHER DISCHARGE INSTRUCTIONS:  Get Medicines reviewed and adjusted: Please take all your medications with you for your next visit with your Primary MD  Laboratory/radiological data: Please request your  Primary MD to go over all hospital tests and procedure/radiological results at the follow up, please ask your Primary MD to get all Hospital records sent to his/her office.  In some cases, they will be blood work, cultures and biopsy results pending at the time of your discharge. Please request that your primary care M.D. goes through all the records of your hospital data and follows up on these results.  Also Note the following: If you experience worsening of your admission symptoms, develop shortness of breath, life threatening emergency, suicidal or homicidal thoughts you must seek medical attention immediately by calling 911 or calling your MD immediately  if symptoms less severe.  You must read complete instructions/literature along with all the possible adverse reactions/side effects for all the Medicines you take and that have been prescribed to you. Take any new Medicines after you have completely understood and accpet all the possible adverse reactions/side effects.   Do not drive when taking Pain medications or sleeping medications (Benzodaizepines)  Do not take more than prescribed Pain, Sleep and Anxiety Medications. It is not advisable to combine anxiety,sleep and pain medications without talking with your primary care practitioner  Special Instructions: If you have smoked or chewed Tobacco  in the last 2 yrs please stop smoking, stop any regular Alcohol  and or any Recreational drug use.  Wear Seat belts while driving.  Please note: You were cared for by a hospitalist during your hospital stay. Once you are discharged, your primary care physician will handle any further medical issues. Please note that NO REFILLS for any discharge medications will be authorized once you are discharged, as it is imperative that you return to your primary care physician (or establish a relationship with a primary care physician if you do not have one) for your post hospital discharge needs so that they  can reassess your need for medications and  monitor your lab values.  Total Time spent coordinating discharge including counseling, education and face to face time equals  45 minutes.  SignedJeoffrey Massed 12/09/2016 1:01 PM

## 2016-12-09 NOTE — Progress Notes (Signed)
Patient seen and examined earlier this morning No further diarrhea Telemetry-continues to show rate controlled A. Fib-per patient, she always remains in A. fib-sounds like she has chronic atrial fibrillation.  Please see discharge summary for further details

## 2016-12-11 LAB — CULTURE, BLOOD (ROUTINE X 2)
CULTURE: NO GROWTH
Culture: NO GROWTH

## 2017-02-18 DIAGNOSIS — E785 Hyperlipidemia, unspecified: Secondary | ICD-10-CM | POA: Diagnosis not present

## 2017-02-18 DIAGNOSIS — Z0000 Encounter for general adult medical examination without abnormal findings: Secondary | ICD-10-CM | POA: Diagnosis not present

## 2017-02-19 DIAGNOSIS — I509 Heart failure, unspecified: Secondary | ICD-10-CM | POA: Diagnosis not present

## 2017-02-19 DIAGNOSIS — E785 Hyperlipidemia, unspecified: Secondary | ICD-10-CM | POA: Diagnosis not present

## 2017-03-04 DIAGNOSIS — I481 Persistent atrial fibrillation: Secondary | ICD-10-CM | POA: Diagnosis not present

## 2017-03-04 DIAGNOSIS — E785 Hyperlipidemia, unspecified: Secondary | ICD-10-CM | POA: Diagnosis not present

## 2017-03-04 DIAGNOSIS — J449 Chronic obstructive pulmonary disease, unspecified: Secondary | ICD-10-CM | POA: Diagnosis not present

## 2017-03-04 DIAGNOSIS — I10 Essential (primary) hypertension: Secondary | ICD-10-CM | POA: Diagnosis not present

## 2017-03-04 DIAGNOSIS — G4730 Sleep apnea, unspecified: Secondary | ICD-10-CM | POA: Diagnosis not present

## 2017-03-15 DIAGNOSIS — I481 Persistent atrial fibrillation: Secondary | ICD-10-CM | POA: Diagnosis not present

## 2017-06-14 DIAGNOSIS — N119 Chronic tubulo-interstitial nephritis, unspecified: Secondary | ICD-10-CM | POA: Diagnosis not present

## 2017-06-14 DIAGNOSIS — N3001 Acute cystitis with hematuria: Secondary | ICD-10-CM | POA: Diagnosis not present

## 2017-07-02 DIAGNOSIS — E785 Hyperlipidemia, unspecified: Secondary | ICD-10-CM | POA: Diagnosis not present

## 2017-07-02 DIAGNOSIS — G4730 Sleep apnea, unspecified: Secondary | ICD-10-CM | POA: Diagnosis not present

## 2017-07-02 DIAGNOSIS — J449 Chronic obstructive pulmonary disease, unspecified: Secondary | ICD-10-CM | POA: Diagnosis not present

## 2017-07-02 DIAGNOSIS — I481 Persistent atrial fibrillation: Secondary | ICD-10-CM | POA: Diagnosis not present

## 2017-07-22 DIAGNOSIS — I481 Persistent atrial fibrillation: Secondary | ICD-10-CM | POA: Diagnosis not present

## 2017-07-22 DIAGNOSIS — I700 Atherosclerosis of aorta: Secondary | ICD-10-CM | POA: Diagnosis not present

## 2017-07-22 DIAGNOSIS — I482 Chronic atrial fibrillation: Secondary | ICD-10-CM | POA: Diagnosis not present

## 2017-07-28 DIAGNOSIS — Z79899 Other long term (current) drug therapy: Secondary | ICD-10-CM | POA: Diagnosis not present

## 2017-07-28 DIAGNOSIS — Z1211 Encounter for screening for malignant neoplasm of colon: Secondary | ICD-10-CM | POA: Diagnosis not present

## 2017-07-28 DIAGNOSIS — R7303 Prediabetes: Secondary | ICD-10-CM | POA: Diagnosis not present

## 2017-07-28 DIAGNOSIS — E559 Vitamin D deficiency, unspecified: Secondary | ICD-10-CM | POA: Diagnosis not present

## 2017-07-28 DIAGNOSIS — R945 Abnormal results of liver function studies: Secondary | ICD-10-CM | POA: Diagnosis not present

## 2017-07-28 DIAGNOSIS — Z1389 Encounter for screening for other disorder: Secondary | ICD-10-CM | POA: Diagnosis not present

## 2017-07-28 DIAGNOSIS — I509 Heart failure, unspecified: Secondary | ICD-10-CM | POA: Diagnosis not present

## 2017-07-28 DIAGNOSIS — Z0000 Encounter for general adult medical examination without abnormal findings: Secondary | ICD-10-CM | POA: Diagnosis not present

## 2017-07-28 DIAGNOSIS — J449 Chronic obstructive pulmonary disease, unspecified: Secondary | ICD-10-CM | POA: Diagnosis not present

## 2017-07-28 DIAGNOSIS — E785 Hyperlipidemia, unspecified: Secondary | ICD-10-CM | POA: Diagnosis not present

## 2017-07-28 DIAGNOSIS — E669 Obesity, unspecified: Secondary | ICD-10-CM | POA: Diagnosis not present

## 2017-07-29 DIAGNOSIS — I10 Essential (primary) hypertension: Secondary | ICD-10-CM | POA: Diagnosis not present

## 2017-07-29 DIAGNOSIS — Z79899 Other long term (current) drug therapy: Secondary | ICD-10-CM | POA: Diagnosis not present

## 2017-07-29 DIAGNOSIS — Z950 Presence of cardiac pacemaker: Secondary | ICD-10-CM | POA: Diagnosis not present

## 2017-07-29 DIAGNOSIS — I481 Persistent atrial fibrillation: Secondary | ICD-10-CM | POA: Diagnosis not present

## 2017-07-29 DIAGNOSIS — J449 Chronic obstructive pulmonary disease, unspecified: Secondary | ICD-10-CM | POA: Diagnosis not present

## 2017-07-29 DIAGNOSIS — Z87891 Personal history of nicotine dependence: Secondary | ICD-10-CM | POA: Diagnosis not present

## 2017-07-29 DIAGNOSIS — I482 Chronic atrial fibrillation: Secondary | ICD-10-CM | POA: Diagnosis not present

## 2017-07-29 DIAGNOSIS — I442 Atrioventricular block, complete: Secondary | ICD-10-CM | POA: Diagnosis not present

## 2017-08-02 DIAGNOSIS — Z45018 Encounter for adjustment and management of other part of cardiac pacemaker: Secondary | ICD-10-CM | POA: Diagnosis not present

## 2017-08-02 DIAGNOSIS — I442 Atrioventricular block, complete: Secondary | ICD-10-CM | POA: Diagnosis not present

## 2017-08-02 DIAGNOSIS — I97190 Other postprocedural cardiac functional disturbances following cardiac surgery: Secondary | ICD-10-CM | POA: Diagnosis not present

## 2017-08-24 DIAGNOSIS — R945 Abnormal results of liver function studies: Secondary | ICD-10-CM | POA: Diagnosis not present

## 2017-08-24 DIAGNOSIS — K760 Fatty (change of) liver, not elsewhere classified: Secondary | ICD-10-CM | POA: Diagnosis not present

## 2017-08-27 DIAGNOSIS — Z23 Encounter for immunization: Secondary | ICD-10-CM | POA: Diagnosis not present

## 2017-09-13 DIAGNOSIS — J029 Acute pharyngitis, unspecified: Secondary | ICD-10-CM | POA: Diagnosis not present

## 2017-09-15 DIAGNOSIS — H524 Presbyopia: Secondary | ICD-10-CM | POA: Diagnosis not present

## 2017-09-22 IMAGING — CR DG ABD PORTABLE 1V
1 series · 1 of 1 positions shown · non-contrast
Comparison: None.

CLINICAL DATA: Abdominal distention.

EXAM:
PORTABLE ABDOMEN - 1 VIEW

[AP]
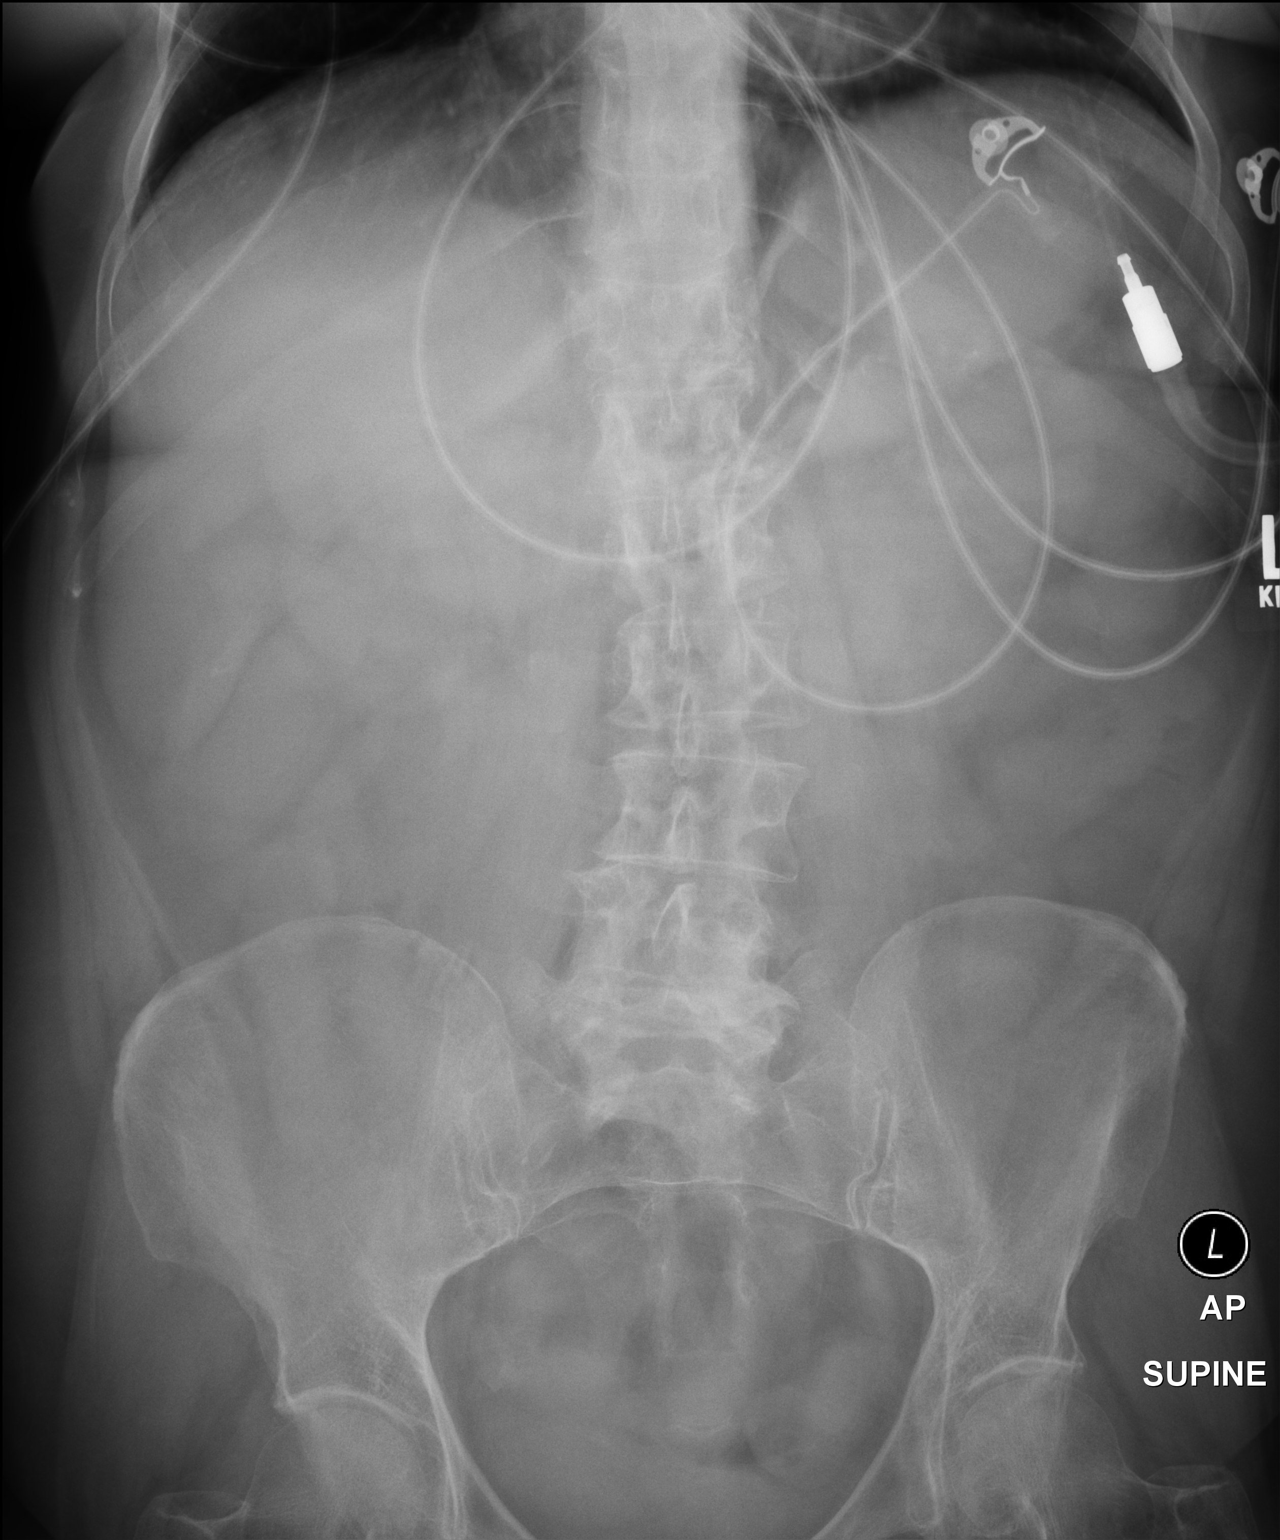

[1 of 1 positions shown; findings below may reference images not displayed]

FINDINGS: Paucity of bowel gas. No gaseous bowel distention. No evidence of
free air on single supine view. No radiopaque calculi. No acute
osseous abnormalities.
IMPRESSION: Nonspecific bowel-gas pattern with generalized paucity of bowel gas.

## 2017-11-01 DIAGNOSIS — Z950 Presence of cardiac pacemaker: Secondary | ICD-10-CM | POA: Diagnosis not present

## 2017-12-07 DIAGNOSIS — H25013 Cortical age-related cataract, bilateral: Secondary | ICD-10-CM | POA: Diagnosis not present

## 2017-12-07 DIAGNOSIS — H25043 Posterior subcapsular polar age-related cataract, bilateral: Secondary | ICD-10-CM | POA: Diagnosis not present

## 2017-12-07 DIAGNOSIS — H2513 Age-related nuclear cataract, bilateral: Secondary | ICD-10-CM | POA: Diagnosis not present

## 2017-12-07 DIAGNOSIS — H2511 Age-related nuclear cataract, right eye: Secondary | ICD-10-CM | POA: Diagnosis not present

## 2017-12-07 DIAGNOSIS — H02839 Dermatochalasis of unspecified eye, unspecified eyelid: Secondary | ICD-10-CM | POA: Diagnosis not present

## 2017-12-09 DIAGNOSIS — I442 Atrioventricular block, complete: Secondary | ICD-10-CM | POA: Diagnosis not present

## 2017-12-09 DIAGNOSIS — J449 Chronic obstructive pulmonary disease, unspecified: Secondary | ICD-10-CM | POA: Diagnosis not present

## 2017-12-09 DIAGNOSIS — Z45018 Encounter for adjustment and management of other part of cardiac pacemaker: Secondary | ICD-10-CM | POA: Diagnosis not present

## 2017-12-09 DIAGNOSIS — J439 Emphysema, unspecified: Secondary | ICD-10-CM | POA: Diagnosis not present

## 2017-12-09 DIAGNOSIS — I97190 Other postprocedural cardiac functional disturbances following cardiac surgery: Secondary | ICD-10-CM | POA: Diagnosis not present

## 2018-01-10 DIAGNOSIS — J449 Chronic obstructive pulmonary disease, unspecified: Secondary | ICD-10-CM | POA: Diagnosis not present

## 2018-01-10 DIAGNOSIS — Z87891 Personal history of nicotine dependence: Secondary | ICD-10-CM | POA: Diagnosis not present

## 2018-01-20 DIAGNOSIS — Z87891 Personal history of nicotine dependence: Secondary | ICD-10-CM | POA: Diagnosis not present

## 2018-01-21 DIAGNOSIS — J069 Acute upper respiratory infection, unspecified: Secondary | ICD-10-CM | POA: Diagnosis not present

## 2018-01-21 DIAGNOSIS — J0100 Acute maxillary sinusitis, unspecified: Secondary | ICD-10-CM | POA: Diagnosis not present

## 2018-01-27 DIAGNOSIS — R69 Illness, unspecified: Secondary | ICD-10-CM | POA: Diagnosis not present

## 2018-01-27 DIAGNOSIS — E785 Hyperlipidemia, unspecified: Secondary | ICD-10-CM | POA: Diagnosis not present

## 2018-01-27 DIAGNOSIS — Z79899 Other long term (current) drug therapy: Secondary | ICD-10-CM | POA: Diagnosis not present

## 2018-01-27 DIAGNOSIS — J449 Chronic obstructive pulmonary disease, unspecified: Secondary | ICD-10-CM | POA: Diagnosis not present

## 2018-01-27 DIAGNOSIS — K210 Gastro-esophageal reflux disease with esophagitis: Secondary | ICD-10-CM | POA: Diagnosis not present

## 2018-01-27 DIAGNOSIS — E559 Vitamin D deficiency, unspecified: Secondary | ICD-10-CM | POA: Diagnosis not present

## 2018-01-27 DIAGNOSIS — R7303 Prediabetes: Secondary | ICD-10-CM | POA: Diagnosis not present

## 2018-01-27 DIAGNOSIS — M109 Gout, unspecified: Secondary | ICD-10-CM | POA: Diagnosis not present

## 2018-02-10 DIAGNOSIS — R918 Other nonspecific abnormal finding of lung field: Secondary | ICD-10-CM | POA: Diagnosis not present

## 2018-02-10 DIAGNOSIS — J449 Chronic obstructive pulmonary disease, unspecified: Secondary | ICD-10-CM | POA: Diagnosis not present

## 2018-02-14 DIAGNOSIS — H2511 Age-related nuclear cataract, right eye: Secondary | ICD-10-CM | POA: Diagnosis not present

## 2018-02-14 DIAGNOSIS — Z961 Presence of intraocular lens: Secondary | ICD-10-CM | POA: Diagnosis not present

## 2018-02-14 DIAGNOSIS — H25811 Combined forms of age-related cataract, right eye: Secondary | ICD-10-CM | POA: Diagnosis not present

## 2018-02-14 DIAGNOSIS — Z9841 Cataract extraction status, right eye: Secondary | ICD-10-CM | POA: Diagnosis not present

## 2018-02-15 DIAGNOSIS — H2512 Age-related nuclear cataract, left eye: Secondary | ICD-10-CM | POA: Diagnosis not present

## 2018-02-24 DIAGNOSIS — J449 Chronic obstructive pulmonary disease, unspecified: Secondary | ICD-10-CM | POA: Diagnosis not present

## 2018-02-24 DIAGNOSIS — R0902 Hypoxemia: Secondary | ICD-10-CM | POA: Diagnosis not present

## 2018-03-04 DIAGNOSIS — Z9841 Cataract extraction status, right eye: Secondary | ICD-10-CM | POA: Diagnosis not present

## 2018-03-04 DIAGNOSIS — H25812 Combined forms of age-related cataract, left eye: Secondary | ICD-10-CM | POA: Diagnosis not present

## 2018-03-04 DIAGNOSIS — Z9842 Cataract extraction status, left eye: Secondary | ICD-10-CM | POA: Diagnosis not present

## 2018-03-04 DIAGNOSIS — H2512 Age-related nuclear cataract, left eye: Secondary | ICD-10-CM | POA: Diagnosis not present

## 2018-03-04 DIAGNOSIS — Z961 Presence of intraocular lens: Secondary | ICD-10-CM | POA: Diagnosis not present

## 2018-03-09 DIAGNOSIS — Z950 Presence of cardiac pacemaker: Secondary | ICD-10-CM | POA: Diagnosis not present

## 2018-03-10 DIAGNOSIS — G4734 Idiopathic sleep related nonobstructive alveolar hypoventilation: Secondary | ICD-10-CM | POA: Diagnosis not present

## 2018-03-10 DIAGNOSIS — J449 Chronic obstructive pulmonary disease, unspecified: Secondary | ICD-10-CM | POA: Diagnosis not present

## 2018-03-10 DIAGNOSIS — R918 Other nonspecific abnormal finding of lung field: Secondary | ICD-10-CM | POA: Diagnosis not present

## 2018-03-11 DIAGNOSIS — J449 Chronic obstructive pulmonary disease, unspecified: Secondary | ICD-10-CM | POA: Diagnosis not present

## 2018-03-24 DIAGNOSIS — R918 Other nonspecific abnormal finding of lung field: Secondary | ICD-10-CM | POA: Diagnosis not present

## 2018-04-11 DIAGNOSIS — J449 Chronic obstructive pulmonary disease, unspecified: Secondary | ICD-10-CM | POA: Diagnosis not present

## 2018-04-26 DIAGNOSIS — R918 Other nonspecific abnormal finding of lung field: Secondary | ICD-10-CM | POA: Diagnosis not present

## 2018-04-26 DIAGNOSIS — J439 Emphysema, unspecified: Secondary | ICD-10-CM | POA: Diagnosis not present

## 2018-05-10 DIAGNOSIS — J449 Chronic obstructive pulmonary disease, unspecified: Secondary | ICD-10-CM | POA: Diagnosis not present

## 2018-05-10 DIAGNOSIS — R918 Other nonspecific abnormal finding of lung field: Secondary | ICD-10-CM | POA: Diagnosis not present

## 2018-05-11 DIAGNOSIS — J449 Chronic obstructive pulmonary disease, unspecified: Secondary | ICD-10-CM | POA: Diagnosis not present

## 2018-05-12 DIAGNOSIS — R918 Other nonspecific abnormal finding of lung field: Secondary | ICD-10-CM | POA: Diagnosis not present

## 2018-05-17 DIAGNOSIS — W19XXXA Unspecified fall, initial encounter: Secondary | ICD-10-CM | POA: Diagnosis not present

## 2018-05-17 DIAGNOSIS — S92345A Nondisplaced fracture of fourth metatarsal bone, left foot, initial encounter for closed fracture: Secondary | ICD-10-CM | POA: Diagnosis not present

## 2018-05-17 DIAGNOSIS — S92355A Nondisplaced fracture of fifth metatarsal bone, left foot, initial encounter for closed fracture: Secondary | ICD-10-CM | POA: Diagnosis not present

## 2018-05-18 DIAGNOSIS — S92902A Unspecified fracture of left foot, initial encounter for closed fracture: Secondary | ICD-10-CM | POA: Diagnosis not present

## 2018-05-19 DIAGNOSIS — S92309A Fracture of unspecified metatarsal bone(s), unspecified foot, initial encounter for closed fracture: Secondary | ICD-10-CM | POA: Diagnosis not present

## 2018-06-07 DIAGNOSIS — I442 Atrioventricular block, complete: Secondary | ICD-10-CM | POA: Diagnosis not present

## 2018-06-07 DIAGNOSIS — I10 Essential (primary) hypertension: Secondary | ICD-10-CM | POA: Diagnosis not present

## 2018-06-07 DIAGNOSIS — I97190 Other postprocedural cardiac functional disturbances following cardiac surgery: Secondary | ICD-10-CM | POA: Diagnosis not present

## 2018-06-07 DIAGNOSIS — J439 Emphysema, unspecified: Secondary | ICD-10-CM | POA: Diagnosis not present

## 2018-06-07 DIAGNOSIS — G4734 Idiopathic sleep related nonobstructive alveolar hypoventilation: Secondary | ICD-10-CM | POA: Diagnosis not present

## 2018-06-07 DIAGNOSIS — Z45018 Encounter for adjustment and management of other part of cardiac pacemaker: Secondary | ICD-10-CM | POA: Diagnosis not present

## 2018-06-07 DIAGNOSIS — E785 Hyperlipidemia, unspecified: Secondary | ICD-10-CM | POA: Diagnosis not present

## 2018-06-07 DIAGNOSIS — I482 Chronic atrial fibrillation: Secondary | ICD-10-CM | POA: Diagnosis not present

## 2018-06-11 DIAGNOSIS — J449 Chronic obstructive pulmonary disease, unspecified: Secondary | ICD-10-CM | POA: Diagnosis not present

## 2018-06-14 DIAGNOSIS — J449 Chronic obstructive pulmonary disease, unspecified: Secondary | ICD-10-CM | POA: Diagnosis not present

## 2018-06-14 DIAGNOSIS — Z79899 Other long term (current) drug therapy: Secondary | ICD-10-CM | POA: Diagnosis not present

## 2018-06-14 DIAGNOSIS — K210 Gastro-esophageal reflux disease with esophagitis: Secondary | ICD-10-CM | POA: Diagnosis not present

## 2018-06-14 DIAGNOSIS — R69 Illness, unspecified: Secondary | ICD-10-CM | POA: Diagnosis not present

## 2018-06-14 DIAGNOSIS — R7303 Prediabetes: Secondary | ICD-10-CM | POA: Diagnosis not present

## 2018-06-14 DIAGNOSIS — I4891 Unspecified atrial fibrillation: Secondary | ICD-10-CM | POA: Diagnosis not present

## 2018-06-14 DIAGNOSIS — E559 Vitamin D deficiency, unspecified: Secondary | ICD-10-CM | POA: Diagnosis not present

## 2018-06-14 DIAGNOSIS — E785 Hyperlipidemia, unspecified: Secondary | ICD-10-CM | POA: Diagnosis not present

## 2018-06-14 DIAGNOSIS — M109 Gout, unspecified: Secondary | ICD-10-CM | POA: Diagnosis not present

## 2018-06-30 DIAGNOSIS — S92309A Fracture of unspecified metatarsal bone(s), unspecified foot, initial encounter for closed fracture: Secondary | ICD-10-CM | POA: Diagnosis not present

## 2018-06-30 DIAGNOSIS — S92302D Fracture of unspecified metatarsal bone(s), left foot, subsequent encounter for fracture with routine healing: Secondary | ICD-10-CM | POA: Diagnosis not present

## 2018-07-12 DIAGNOSIS — J449 Chronic obstructive pulmonary disease, unspecified: Secondary | ICD-10-CM | POA: Diagnosis not present

## 2018-08-09 DIAGNOSIS — Z950 Presence of cardiac pacemaker: Secondary | ICD-10-CM | POA: Diagnosis not present

## 2018-08-11 DIAGNOSIS — J449 Chronic obstructive pulmonary disease, unspecified: Secondary | ICD-10-CM | POA: Diagnosis not present

## 2018-09-11 DIAGNOSIS — J449 Chronic obstructive pulmonary disease, unspecified: Secondary | ICD-10-CM | POA: Diagnosis not present

## 2018-10-11 DIAGNOSIS — J449 Chronic obstructive pulmonary disease, unspecified: Secondary | ICD-10-CM | POA: Diagnosis not present

## 2018-11-07 DIAGNOSIS — J22 Unspecified acute lower respiratory infection: Secondary | ICD-10-CM | POA: Diagnosis not present

## 2018-11-09 DIAGNOSIS — Z950 Presence of cardiac pacemaker: Secondary | ICD-10-CM | POA: Diagnosis not present

## 2018-11-11 DIAGNOSIS — J449 Chronic obstructive pulmonary disease, unspecified: Secondary | ICD-10-CM | POA: Diagnosis not present

## 2018-11-16 ENCOUNTER — Encounter (HOSPITAL_COMMUNITY): Payer: Self-pay

## 2018-11-16 ENCOUNTER — Emergency Department (HOSPITAL_COMMUNITY)
Admission: EM | Admit: 2018-11-16 | Discharge: 2018-11-16 | Disposition: A | Discharge status: Home / Self Care | Payer: Medicare HMO | Attending: Emergency Medicine | Admitting: Emergency Medicine

## 2018-11-16 ENCOUNTER — Emergency Department (HOSPITAL_COMMUNITY): Payer: Medicare HMO

## 2018-11-16 DIAGNOSIS — Z7901 Long term (current) use of anticoagulants: Secondary | ICD-10-CM | POA: Insufficient documentation

## 2018-11-16 DIAGNOSIS — J449 Chronic obstructive pulmonary disease, unspecified: Secondary | ICD-10-CM | POA: Insufficient documentation

## 2018-11-16 DIAGNOSIS — E119 Type 2 diabetes mellitus without complications: Secondary | ICD-10-CM | POA: Insufficient documentation

## 2018-11-16 DIAGNOSIS — J209 Acute bronchitis, unspecified: Secondary | ICD-10-CM

## 2018-11-16 DIAGNOSIS — R0602 Shortness of breath: Secondary | ICD-10-CM | POA: Diagnosis not present

## 2018-11-16 DIAGNOSIS — Z87891 Personal history of nicotine dependence: Secondary | ICD-10-CM | POA: Insufficient documentation

## 2018-11-16 DIAGNOSIS — R069 Unspecified abnormalities of breathing: Secondary | ICD-10-CM | POA: Diagnosis not present

## 2018-11-16 DIAGNOSIS — Z79899 Other long term (current) drug therapy: Secondary | ICD-10-CM | POA: Insufficient documentation

## 2018-11-16 DIAGNOSIS — R062 Wheezing: Secondary | ICD-10-CM | POA: Diagnosis not present

## 2018-11-16 LAB — CBC
HCT: 41.7 % (ref 36.0–46.0)
HEMOGLOBIN: 13.2 g/dL (ref 12.0–15.0)
MCH: 27.3 pg (ref 26.0–34.0)
MCHC: 31.7 g/dL (ref 30.0–36.0)
MCV: 86.2 fL (ref 80.0–100.0)
Platelets: 233 10*3/uL (ref 150–400)
RBC: 4.84 MIL/uL (ref 3.87–5.11)
RDW: 14.3 % (ref 11.5–15.5)
WBC: 9.8 10*3/uL (ref 4.0–10.5)
nRBC: 0 % (ref 0.0–0.2)

## 2018-11-16 LAB — BASIC METABOLIC PANEL
ANION GAP: 10 (ref 5–15)
BUN: 16 mg/dL (ref 8–23)
CHLORIDE: 101 mmol/L (ref 98–111)
CO2: 26 mmol/L (ref 22–32)
Calcium: 9.1 mg/dL (ref 8.9–10.3)
Creatinine, Ser: 1.18 mg/dL — ABNORMAL HIGH (ref 0.44–1.00)
GFR calc non Af Amer: 46 mL/min — ABNORMAL LOW (ref >60)
GFR, EST AFRICAN AMERICAN: 54 mL/min — AB (ref >60)
GLUCOSE: 129 mg/dL — AB (ref 70–99)
POTASSIUM: 3.8 mmol/L (ref 3.5–5.1)
Sodium: 137 mmol/L (ref 135–145)

## 2018-11-16 MED ORDER — AZITHROMYCIN 250 MG PO TABS
1000.0000 mg | ORAL_TABLET | Freq: Once | ORAL | Status: AC
  Administered 2018-11-16: 1000 mg via ORAL
  Filled 2018-11-16: qty 4

## 2018-11-16 MED ORDER — AZITHROMYCIN 250 MG PO TABS: 250.0000 mg | ORAL_TABLET | Freq: Every day | ORAL | 0 refills | Status: AC

## 2018-11-16 MED ORDER — HYDROCODONE-HOMATROPINE 5-1.5 MG/5ML PO SYRP: 5.0000 mL | ORAL_SOLUTION | Freq: Four times a day (QID) | ORAL | 0 refills | Status: AC | PRN

## 2018-11-16 MED ORDER — PREDNISONE 20 MG PO TABS
60.0000 mg | ORAL_TABLET | Freq: Once | ORAL | Status: AC
  Administered 2018-11-16: 60 mg via ORAL
  Filled 2018-11-16: qty 3

## 2018-11-16 MED ORDER — PREDNISONE 20 MG PO TABS: ORAL_TABLET | ORAL | 0 refills | Status: AC

## 2018-11-16 NOTE — ED Provider Notes (Signed)
[facility] EMERGENCY DEPARTMENT Provider Note   CSN: [id number] Arrival date & time: 11/16/18  0126     History   Chief Complaint Chief Complaint  Patient presents with  . Shortness of Breath    HPI Erin Waller is a 73 y.o. female.  Patient presents to the emergency department for evaluation of shortness of breath and cough.  Patient reports that she has been sick for 10 days.  She went to urgent care when she first got sick and had an x-ray that did not show pneumonia.  She was given symptomatic treatment but has not improved.  Patient reports that her cough and shortness of breath are worsening.  Patient uses oxygen at night and as needed.     Past Medical History:  Diagnosis Date  . Atrial fibrillation (HCC)   . COPD (chronic obstructive pulmonary disease) (HCC)   . Diabetes mellitus without complication [facility])     Patient Active Problem List   Diagnosis Date Noted  . Atrial fibrillation with RVR (HCC) 12/06/2016  . Sepsis (HCC) 12/06/2016  . Diarrhea 12/06/2016  . Hypokalemia 12/06/2016  . COPD (chronic obstructive pulmonary disease) (HCC) 12/06/2016  . AKI (acute kidney injury) (HCC) 12/06/2016  . Norovirus     History reviewed. No pertinent surgical history.   OB History   No obstetric history on file.      Home Medications    Prior to Admission medications   Medication Sig Start Date End Date Taking? Authorizing Provider  apixaban (ELIQUIS) 5 MG TABS tablet Take 5 mg by mouth 2 (two) times daily.   Yes [provider]  budesonide-formoterol (SYMBICORT) 160-4.5 MCG/ACT inhaler Inhale 2 puffs into the lungs 2 (two) times daily.   Yes [provider]  colchicine 0.6 MG tablet Take 0.6 mg by mouth as needed (gout).    Yes [provider]  dexlansoprazole (DEXILANT) 60 MG capsule Take 60 mg by mouth at bedtime.   Yes [provider]  diltiazem (CARDIZEM CD) 180 MG 24 hr capsule Take 180 mg by mouth  daily.   Yes [provider]  furosemide (LASIX) 20 MG tablet Take 20 mg by mouth daily.   Yes [provider]  nitroGLYCERIN (NITROLINGUAL) 0.4 MG/SPRAY spray Place 1 spray under the tongue every 5 (five) minutes x 3 doses as needed for chest pain.   Yes [provider]  Tiotropium Bromide Monohydrate (SPIRIVA RESPIMAT) 2.5 MCG/ACT AERS Inhale 2 puffs into the lungs 2 (two) times daily. 03/10/18  Yes [provider]  azithromycin (ZITHROMAX) 250 MG tablet Take 1 tablet (250 mg total) by mouth daily. Take first 2 tablets together, then 1 every day until finished. 11/17/18   Gilda Crease, MD  flecainide (TAMBOCOR) 50 MG tablet Take 25 mg by mouth 2 (two) times daily.    [provider]  HYDROcodone-homatropine (HYCODAN) 5-1.5 MG/5ML syrup Take 5 mLs by mouth every 6 (six) hours as needed for cough. 11/16/18   Gilda Crease, MD  predniSONE (DELTASONE) 20 MG tablet 3 tabs po daily x 3 days, then 2 tabs x 3 days, then 1.5 tabs x 3 days, then 1 tab x 3 days, then 0.5 tabs x 3 days 11/17/18   Gilda Crease, MD    Family History No family history on file.  Social History Social History   Tobacco Use  . Smoking status: Former Smoker    Types: Cigarettes  Substance Use Topics  . Alcohol use: No  .  Drug use: No     Allergies   Kiwi extract; Lac bovis; Methocarbamol; Sulfamethoxazole-trimethoprim; Corn oil; Tape; Aminobenzoate; Citalopram; Other; and Sulfa antibiotics   Review of Systems Review of Systems  Respiratory: Positive for cough and shortness of breath.   All other systems reviewed and are negative.    Physical Exam Updated Vital Signs BP (!) 155/80   Pulse 80   Temp 98 F (36.7 C)   Resp (!) 22   SpO2 97%   Physical Exam Vitals signs and nursing note reviewed.  Constitutional:      General: She is not in acute distress.    Appearance: Normal appearance. She is well-developed.  HENT:     Head:  Normocephalic and atraumatic.     Right Ear: Hearing normal.     Left Ear: Hearing normal.     Nose: Nose normal.  Eyes:     Conjunctiva/sclera: Conjunctivae normal.     Pupils: Pupils are equal, round, and reactive to light.  Neck:     Musculoskeletal: Normal range of motion and neck supple.  Cardiovascular:     Rate and Rhythm: Regular rhythm.     Heart sounds: S1 normal and S2 normal. No murmur. No friction rub. No gallop.   Pulmonary:     Effort: Pulmonary effort is normal. No respiratory distress.     Breath sounds: Normal breath sounds.  Chest:     Chest wall: No tenderness.  Abdominal:     General: Bowel sounds are normal.     Palpations: Abdomen is soft.     Tenderness: There is no abdominal tenderness. There is no guarding or rebound. Negative signs include Murphy's sign and McBurney's sign.     Hernia: No hernia is present.  Musculoskeletal: Normal range of motion.  Skin:    General: Skin is warm and dry.     Findings: No rash.  Neurological:     Mental Status: She is alert and oriented to person, place, and time.     GCS: GCS eye subscore is 4. GCS verbal subscore is 5. GCS motor subscore is 6.     Cranial Nerves: No cranial nerve deficit.     Sensory: No sensory deficit.     Coordination: Coordination normal.  Psychiatric:        Speech: Speech normal.        Behavior: Behavior normal.        Thought Content: Thought content normal.      ED Treatments / Results  Labs (all labs ordered are listed, but only abnormal results are displayed) Labs Reviewed  BASIC METABOLIC PANEL - Abnormal; Notable for the following components:      Result Value   Glucose, Bld 129 (*)    Creatinine, Ser 1.18 (*)    GFR calc non Af Amer 46 (*)    GFR calc Af Amer 54 (*)    All other components within normal limits  CBC    EKG EKG Interpretation  Date/Time:  Wednesday November 16 2018 01:44:20 EST Ventricular Rate:  80 PR Interval:    QRS Duration: 158 QT  Interval:  444 QTC Calculation: 512 R Axis:   157 Text Interpretation:  Ventricular-paced rhythm Abnormal ECG Confirmed by Gilda Crease [phone]) on 11/17/2018 5:08:06 PM   Radiology No results found.  Procedures Procedures (including critical care time)  Medications Ordered in ED Medications  azithromycin (ZITHROMAX) tablet 1,000 mg (1,000 mg Oral Given 11/16/18 0611)  predniSONE (DELTASONE) tablet 60 mg (  60 mg Oral Given 11/16/18 [id number])     Initial Impression / Assessment and Plan / ED Course  I have reviewed the triage vital signs and the nursing notes.  Pertinent labs & imaging results that were available during my care of the patient were reviewed by me and considered in my medical decision making (see chart for details).     Patient presents for evaluation of shortness of breath and cough.  Symptoms ongoing for 10 days.  Patient reports no improvement with symptomatic treatment.  Her chest x-ray today is clear, no evidence of pneumonia.  She is wearing supplemental oxygen currently with oxygen saturations of 99%.  Respiratory rate is normal, heart rate is 80.  She is afebrile.  Lungs are clear currently no signs of wheezing.  With prolonged illness, will treat with antibiotics, prednisone.  Final Clinical Impressions(s) / ED Diagnoses   Final diagnoses:  Acute bronchitis, unspecified organism    ED Discharge Orders         Ordered    azithromycin (ZITHROMAX) 250 MG tablet  Daily     11/16/18 0611    predniSONE (DELTASONE) 20 MG tablet     11/16/18 0611    HYDROcodone-homatropine (HYCODAN) 5-1.5 MG/5ML syrup  Every 6 hours PRN     11/16/18 [id number]           Gilda CreasePollina, Christopher J, MD 11/24/18 [phone]

## 2018-11-16 NOTE — ED Triage Notes (Signed)
Pt having SOB for the past 10 days, seen at [facility] and had x ray done, told she did not have pneumonia but put on antibiotics and prednisone, did not complete antibiotic. PTA received 2.5  Albuterol, wears oxygen at night, on 3L

## 2018-12-06 DIAGNOSIS — Z23 Encounter for immunization: Secondary | ICD-10-CM | POA: Diagnosis not present

## 2018-12-06 DIAGNOSIS — J439 Emphysema, unspecified: Secondary | ICD-10-CM | POA: Diagnosis not present

## 2018-12-12 DIAGNOSIS — J449 Chronic obstructive pulmonary disease, unspecified: Secondary | ICD-10-CM | POA: Diagnosis not present

## 2019-01-10 DIAGNOSIS — J449 Chronic obstructive pulmonary disease, unspecified: Secondary | ICD-10-CM | POA: Diagnosis not present

## 2019-01-19 DIAGNOSIS — E785 Hyperlipidemia, unspecified: Secondary | ICD-10-CM | POA: Diagnosis not present

## 2019-01-19 DIAGNOSIS — R739 Hyperglycemia, unspecified: Secondary | ICD-10-CM | POA: Diagnosis not present

## 2019-01-19 DIAGNOSIS — Z131 Encounter for screening for diabetes mellitus: Secondary | ICD-10-CM | POA: Diagnosis not present

## 2019-01-19 DIAGNOSIS — N183 Chronic kidney disease, stage 3 (moderate): Secondary | ICD-10-CM | POA: Diagnosis not present

## 2019-01-19 DIAGNOSIS — Z0000 Encounter for general adult medical examination without abnormal findings: Secondary | ICD-10-CM | POA: Diagnosis not present

## 2019-01-19 DIAGNOSIS — I129 Hypertensive chronic kidney disease with stage 1 through stage 4 chronic kidney disease, or unspecified chronic kidney disease: Secondary | ICD-10-CM | POA: Diagnosis not present

## 2019-01-19 DIAGNOSIS — Z87891 Personal history of nicotine dependence: Secondary | ICD-10-CM | POA: Diagnosis not present

## 2019-02-10 DIAGNOSIS — J449 Chronic obstructive pulmonary disease, unspecified: Secondary | ICD-10-CM | POA: Diagnosis not present

## 2019-03-10 DIAGNOSIS — I442 Atrioventricular block, complete: Secondary | ICD-10-CM | POA: Diagnosis not present

## 2019-03-10 DIAGNOSIS — I119 Hypertensive heart disease without heart failure: Secondary | ICD-10-CM | POA: Diagnosis not present

## 2019-03-10 DIAGNOSIS — Z45018 Encounter for adjustment and management of other part of cardiac pacemaker: Secondary | ICD-10-CM | POA: Diagnosis not present

## 2019-03-10 DIAGNOSIS — I4821 Permanent atrial fibrillation: Secondary | ICD-10-CM | POA: Diagnosis not present

## 2019-03-10 DIAGNOSIS — I97190 Other postprocedural cardiac functional disturbances following cardiac surgery: Secondary | ICD-10-CM | POA: Diagnosis not present

## 2019-03-10 DIAGNOSIS — E785 Hyperlipidemia, unspecified: Secondary | ICD-10-CM | POA: Diagnosis not present

## 2019-03-10 DIAGNOSIS — G4734 Idiopathic sleep related nonobstructive alveolar hypoventilation: Secondary | ICD-10-CM | POA: Diagnosis not present

## 2019-03-10 DIAGNOSIS — I509 Heart failure, unspecified: Secondary | ICD-10-CM | POA: Diagnosis not present

## 2019-03-10 DIAGNOSIS — I5032 Chronic diastolic (congestive) heart failure: Secondary | ICD-10-CM | POA: Diagnosis not present

## 2019-03-10 DIAGNOSIS — J439 Emphysema, unspecified: Secondary | ICD-10-CM | POA: Diagnosis not present

## 2019-03-12 DIAGNOSIS — J449 Chronic obstructive pulmonary disease, unspecified: Secondary | ICD-10-CM | POA: Diagnosis not present

## 2019-03-14 DIAGNOSIS — Z950 Presence of cardiac pacemaker: Secondary | ICD-10-CM | POA: Diagnosis not present

## 2019-04-03 DIAGNOSIS — H52222 Regular astigmatism, left eye: Secondary | ICD-10-CM | POA: Diagnosis not present

## 2019-04-12 DIAGNOSIS — J449 Chronic obstructive pulmonary disease, unspecified: Secondary | ICD-10-CM | POA: Diagnosis not present

## 2019-05-12 DIAGNOSIS — J449 Chronic obstructive pulmonary disease, unspecified: Secondary | ICD-10-CM | POA: Diagnosis not present

## 2019-05-16 DIAGNOSIS — G4734 Idiopathic sleep related nonobstructive alveolar hypoventilation: Secondary | ICD-10-CM | POA: Diagnosis not present

## 2019-05-16 DIAGNOSIS — R918 Other nonspecific abnormal finding of lung field: Secondary | ICD-10-CM | POA: Diagnosis not present

## 2019-05-16 DIAGNOSIS — J449 Chronic obstructive pulmonary disease, unspecified: Secondary | ICD-10-CM | POA: Diagnosis not present

## 2019-05-16 DIAGNOSIS — Z87891 Personal history of nicotine dependence: Secondary | ICD-10-CM | POA: Diagnosis not present

## 2019-05-26 DIAGNOSIS — M1A09X0 Idiopathic chronic gout, multiple sites, without tophus (tophi): Secondary | ICD-10-CM | POA: Diagnosis not present

## 2019-06-10 DIAGNOSIS — Z87891 Personal history of nicotine dependence: Secondary | ICD-10-CM | POA: Diagnosis not present

## 2019-06-12 DIAGNOSIS — J449 Chronic obstructive pulmonary disease, unspecified: Secondary | ICD-10-CM | POA: Diagnosis not present

## 2019-06-13 DIAGNOSIS — Z950 Presence of cardiac pacemaker: Secondary | ICD-10-CM | POA: Diagnosis not present

## 2019-07-13 DIAGNOSIS — J449 Chronic obstructive pulmonary disease, unspecified: Secondary | ICD-10-CM | POA: Diagnosis not present

## 2019-07-24 DIAGNOSIS — Z23 Encounter for immunization: Secondary | ICD-10-CM | POA: Diagnosis not present

## 2019-07-24 DIAGNOSIS — K219 Gastro-esophageal reflux disease without esophagitis: Secondary | ICD-10-CM | POA: Diagnosis not present

## 2019-07-24 DIAGNOSIS — M1A09X0 Idiopathic chronic gout, multiple sites, without tophus (tophi): Secondary | ICD-10-CM | POA: Diagnosis not present

## 2019-07-24 DIAGNOSIS — Z13820 Encounter for screening for osteoporosis: Secondary | ICD-10-CM | POA: Diagnosis not present

## 2019-07-24 DIAGNOSIS — Z131 Encounter for screening for diabetes mellitus: Secondary | ICD-10-CM | POA: Diagnosis not present

## 2019-07-24 DIAGNOSIS — Z87891 Personal history of nicotine dependence: Secondary | ICD-10-CM | POA: Diagnosis not present

## 2019-07-24 DIAGNOSIS — I10 Essential (primary) hypertension: Secondary | ICD-10-CM | POA: Diagnosis not present

## 2019-07-24 DIAGNOSIS — E785 Hyperlipidemia, unspecified: Secondary | ICD-10-CM | POA: Diagnosis not present

## 2019-08-12 DIAGNOSIS — J449 Chronic obstructive pulmonary disease, unspecified: Secondary | ICD-10-CM | POA: Diagnosis not present

## 2019-08-14 DIAGNOSIS — I517 Cardiomegaly: Secondary | ICD-10-CM | POA: Diagnosis not present

## 2019-08-14 DIAGNOSIS — I361 Nonrheumatic tricuspid (valve) insufficiency: Secondary | ICD-10-CM | POA: Diagnosis not present

## 2019-08-14 DIAGNOSIS — I272 Pulmonary hypertension, unspecified: Secondary | ICD-10-CM | POA: Diagnosis not present

## 2019-08-17 DIAGNOSIS — M810 Age-related osteoporosis without current pathological fracture: Secondary | ICD-10-CM | POA: Diagnosis not present

## 2019-08-17 DIAGNOSIS — N959 Unspecified menopausal and perimenopausal disorder: Secondary | ICD-10-CM | POA: Diagnosis not present

## 2019-08-17 DIAGNOSIS — Z1231 Encounter for screening mammogram for malignant neoplasm of breast: Secondary | ICD-10-CM | POA: Diagnosis not present

## 2019-08-22 DIAGNOSIS — M810 Age-related osteoporosis without current pathological fracture: Secondary | ICD-10-CM | POA: Diagnosis not present

## 2019-08-24 DIAGNOSIS — M1A09X0 Idiopathic chronic gout, multiple sites, without tophus (tophi): Secondary | ICD-10-CM | POA: Diagnosis not present

## 2019-09-12 DIAGNOSIS — Z950 Presence of cardiac pacemaker: Secondary | ICD-10-CM | POA: Diagnosis not present

## 2019-09-12 DIAGNOSIS — J449 Chronic obstructive pulmonary disease, unspecified: Secondary | ICD-10-CM | POA: Diagnosis not present

## 2019-09-19 DIAGNOSIS — Z87891 Personal history of nicotine dependence: Secondary | ICD-10-CM | POA: Diagnosis not present

## 2019-09-19 DIAGNOSIS — Z950 Presence of cardiac pacemaker: Secondary | ICD-10-CM | POA: Diagnosis not present

## 2019-09-19 DIAGNOSIS — I4821 Permanent atrial fibrillation: Secondary | ICD-10-CM | POA: Diagnosis not present

## 2019-09-19 DIAGNOSIS — J449 Chronic obstructive pulmonary disease, unspecified: Secondary | ICD-10-CM | POA: Diagnosis not present

## 2019-09-19 DIAGNOSIS — Z9889 Other specified postprocedural states: Secondary | ICD-10-CM | POA: Diagnosis not present

## 2019-09-19 DIAGNOSIS — E782 Mixed hyperlipidemia: Secondary | ICD-10-CM | POA: Diagnosis not present

## 2019-09-19 DIAGNOSIS — I10 Essential (primary) hypertension: Secondary | ICD-10-CM | POA: Diagnosis not present

## 2019-10-12 DIAGNOSIS — J449 Chronic obstructive pulmonary disease, unspecified: Secondary | ICD-10-CM | POA: Diagnosis not present

## 2022-09-02 DEATH — deceased

## 2022-12-25 ENCOUNTER — Encounter: Payer: Self-pay | Admitting: Gastroenterology
